# Patient Record
Sex: Male | Born: 1955
Health system: Southern US, Community
[De-identification: ages and names within clinical notes are randomized; demographics above are authoritative.]

## PROBLEM LIST (undated history)

## (undated) DIAGNOSIS — C61 Malignant neoplasm of prostate: Secondary | ICD-10-CM

## (undated) DIAGNOSIS — M109 Gout, unspecified: Secondary | ICD-10-CM

## (undated) DIAGNOSIS — B009 Herpesviral infection, unspecified: Secondary | ICD-10-CM

## (undated) DIAGNOSIS — G8929 Other chronic pain: Secondary | ICD-10-CM

## (undated) DIAGNOSIS — IMO0002 Reserved for concepts with insufficient information to code with codable children: Secondary | ICD-10-CM

## (undated) DIAGNOSIS — I10 Essential (primary) hypertension: Secondary | ICD-10-CM

## (undated) DIAGNOSIS — M549 Dorsalgia, unspecified: Secondary | ICD-10-CM

## (undated) DIAGNOSIS — N529 Male erectile dysfunction, unspecified: Secondary | ICD-10-CM

## (undated) HISTORY — DX: Other chronic pain: G89.29

## (undated) HISTORY — PX: COLONOSCOPY: SHX174

## (undated) HISTORY — DX: Dorsalgia, unspecified: M54.9

## (undated) HISTORY — DX: Male erectile dysfunction, unspecified: N52.9

## (undated) HISTORY — DX: Herpesviral infection, unspecified: B00.9

---

## 1999-11-07 HISTORY — PX: KNEE ARTHROSCOPY: SUR90

## 2007-05-08 ENCOUNTER — Encounter: Admission: RE | Admit: 2007-05-08 | Discharge: 2007-05-08 | Payer: Self-pay | Admitting: Family Medicine

## 2008-06-03 ENCOUNTER — Encounter: Admission: RE | Admit: 2008-06-03 | Discharge: 2008-06-03 | Payer: Self-pay | Admitting: Sports Medicine

## 2008-06-16 ENCOUNTER — Encounter: Admission: RE | Admit: 2008-06-16 | Discharge: 2008-06-16 | Payer: Self-pay | Admitting: Sports Medicine

## 2008-07-14 ENCOUNTER — Encounter: Admission: RE | Admit: 2008-07-14 | Discharge: 2008-07-14 | Payer: Self-pay | Admitting: Sports Medicine

## 2008-11-06 HISTORY — PX: LUMBAR FUSION: SHX111

## 2009-01-28 ENCOUNTER — Encounter: Admission: RE | Admit: 2009-01-28 | Discharge: 2009-01-28 | Payer: Self-pay | Admitting: Neurosurgery

## 2009-03-11 ENCOUNTER — Inpatient Hospital Stay (HOSPITAL_COMMUNITY): Admission: RE | Admit: 2009-03-11 | Discharge: 2009-03-15 | Payer: Self-pay | Admitting: Neurosurgery

## 2010-05-12 ENCOUNTER — Emergency Department (HOSPITAL_COMMUNITY): Admission: EM | Admit: 2010-05-12 | Discharge: 2010-05-12 | Payer: Self-pay | Admitting: Emergency Medicine

## 2010-05-18 ENCOUNTER — Ambulatory Visit (HOSPITAL_COMMUNITY): Payer: Self-pay | Admitting: Psychiatry

## 2010-05-18 ENCOUNTER — Other Ambulatory Visit (HOSPITAL_COMMUNITY): Admission: RE | Admit: 2010-05-18 | Discharge: 2010-06-03 | Payer: Self-pay | Admitting: Psychiatry

## 2010-06-03 ENCOUNTER — Ambulatory Visit: Payer: Self-pay | Admitting: Psychiatry

## 2010-09-04 IMAGING — CR DG CHEST 2V
2 series · 2 of 2 positions shown · non-contrast
Comparison: None

CLINICAL DATA: Preoperative evaluation for lumbar stenosis.
Hypertension.  Nonsmoker.  Sleep apnea.

CHEST - 2 VIEW

[view not recorded (1 of 2)]
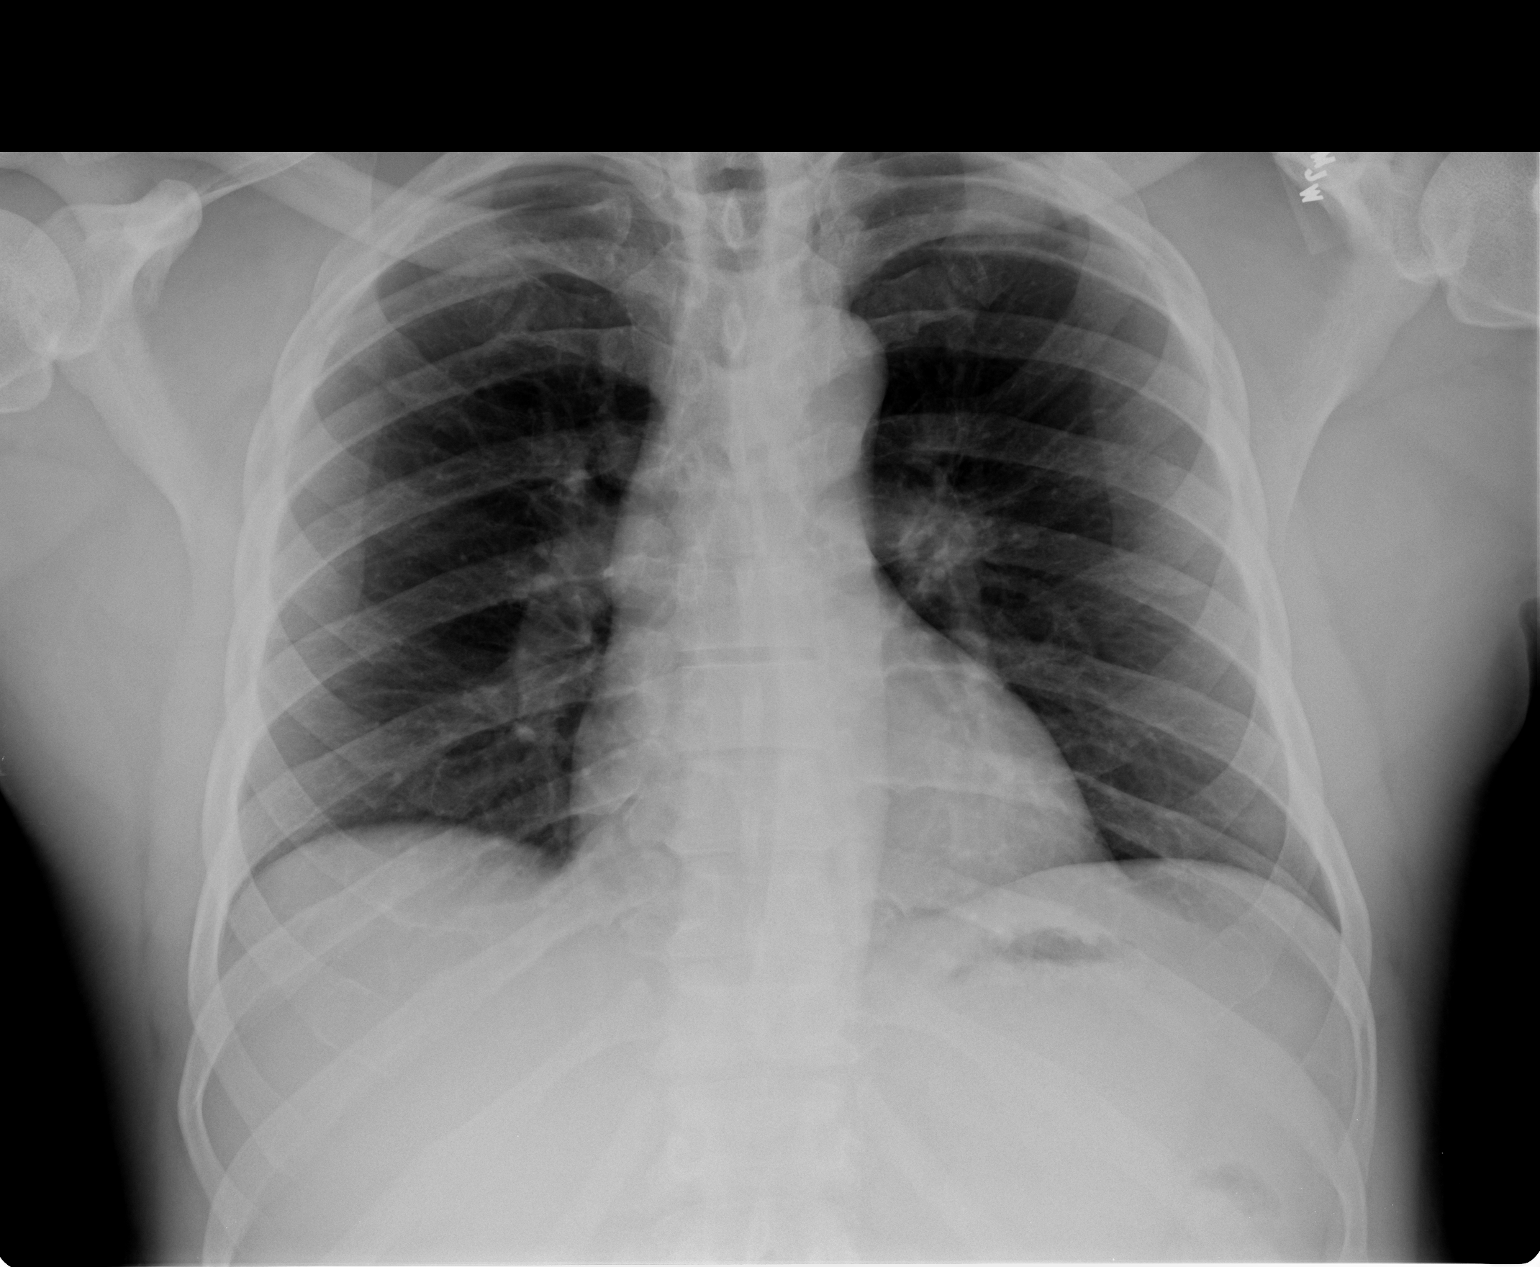

[view not recorded (2 of 2)]
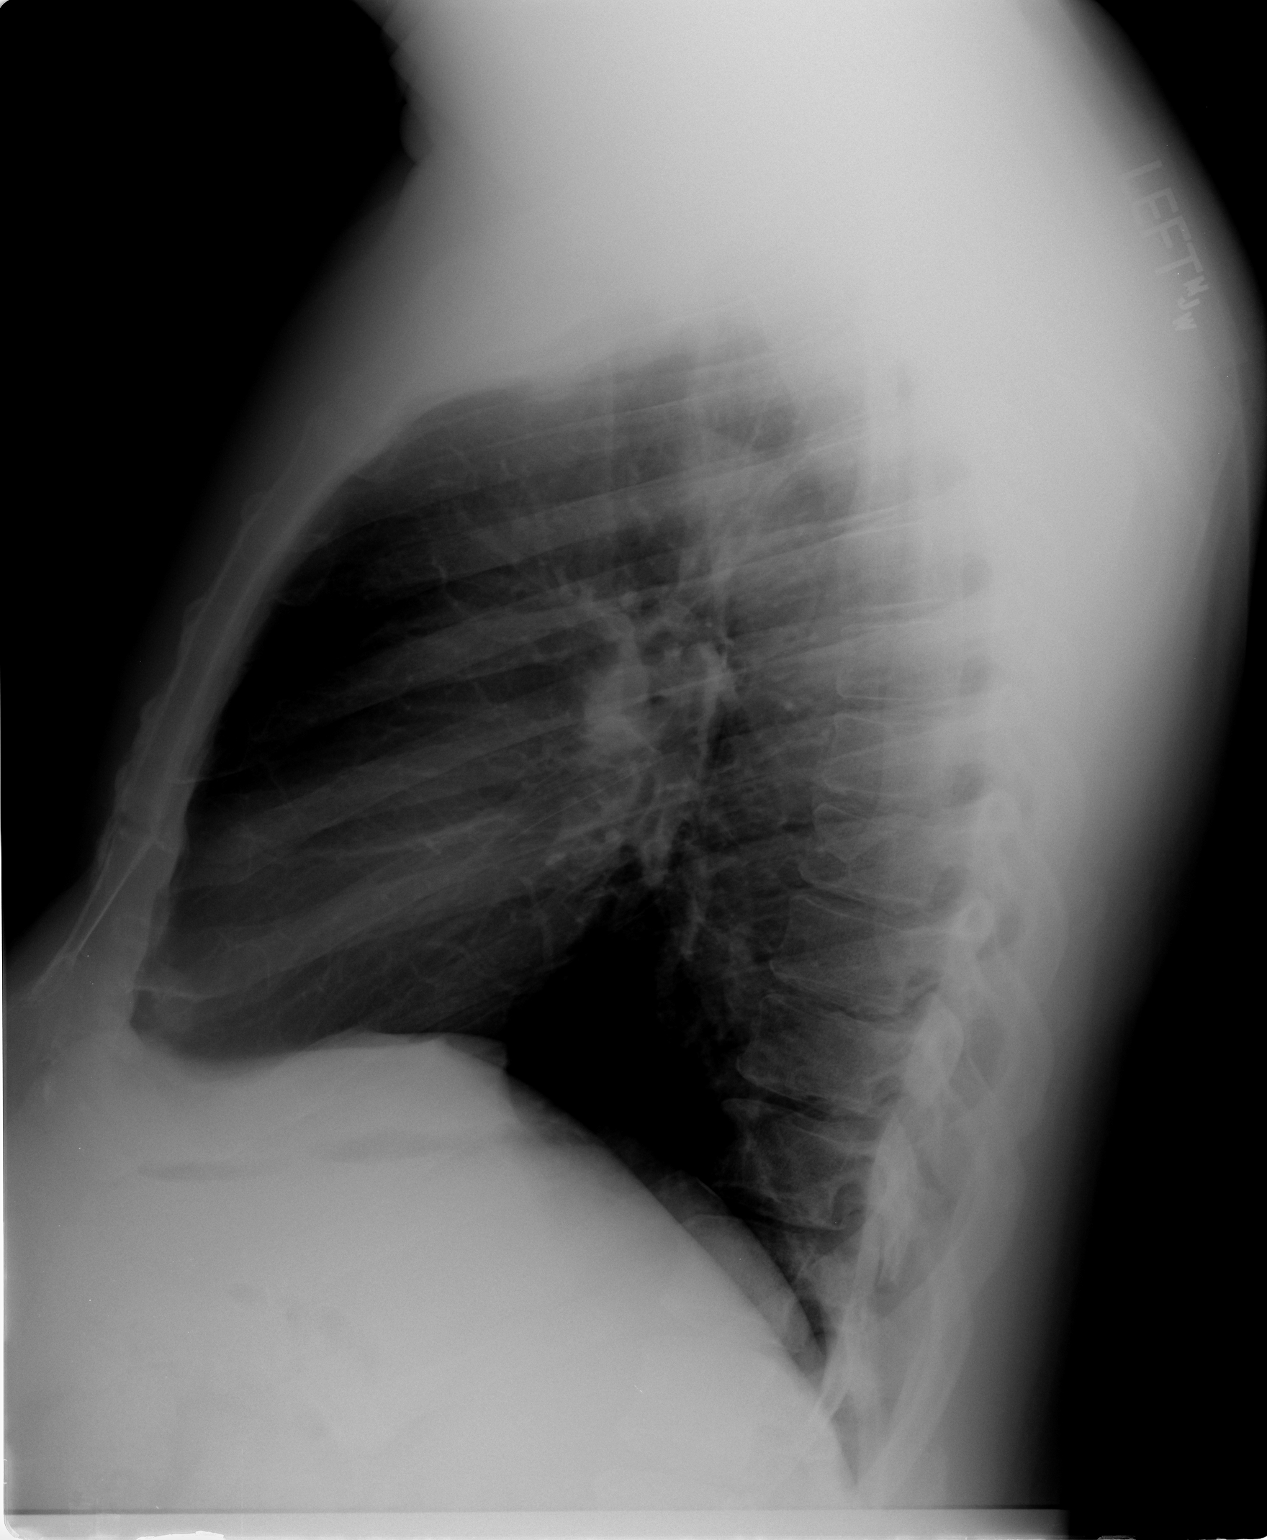

[2 of 2 positions shown; findings below may reference images not displayed]

FINDINGS: Heart and mediastinal contours are within normal limits.
The lung fields appear clear with no evidence for focal infiltrate
or congestive failure.  Bony structures demonstrate some mild
degenerative changes of the lower thoracic spine and are otherwise
intact.
IMPRESSION: No acute cardiopulmonary disease noted

## 2010-09-07 IMAGING — RF DG LUMBAR SPINE 2-3V
1 series · 2 of 2 positions shown · non-contrast
Comparison: Intraoperative plain films of same date.

CLINICAL DATA: Lumbar stenosis.  Degenerative disc disease.

LUMBAR SPINE - 2-3 VIEW

[Series 1: run · 2 of 2 slices shown]
[im 1/2]
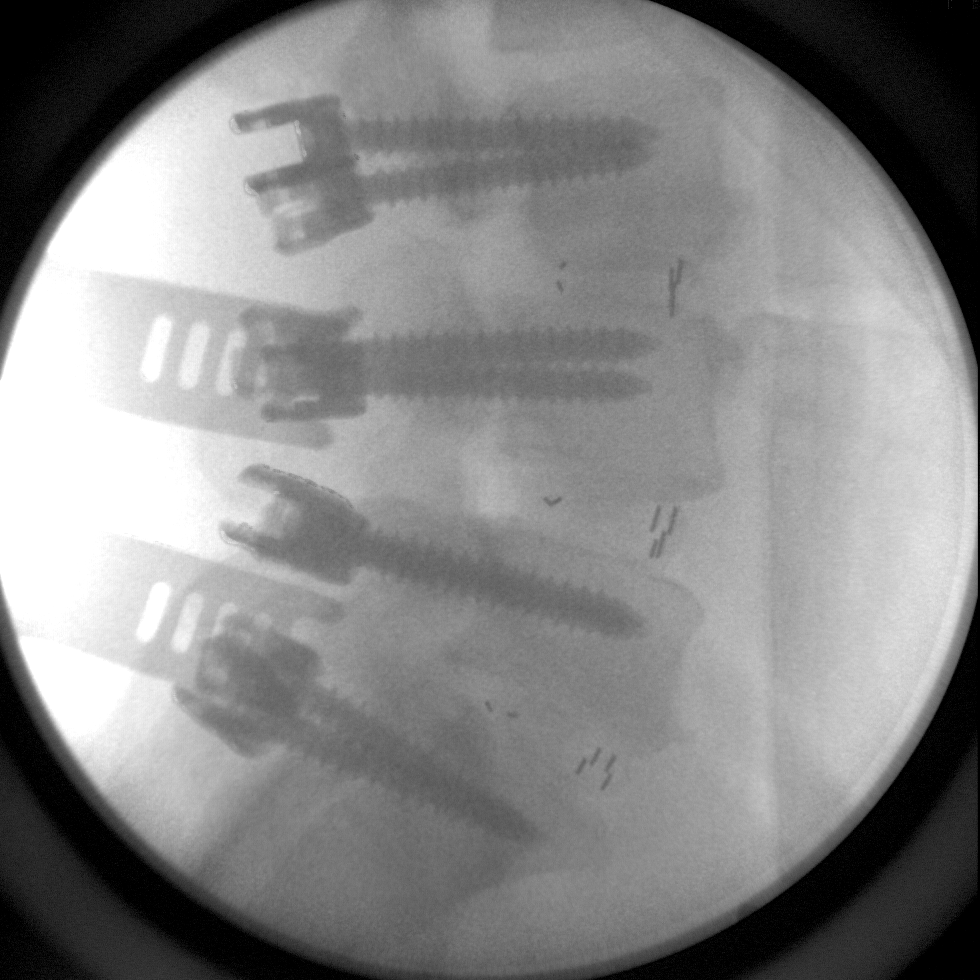
[im 2/2]
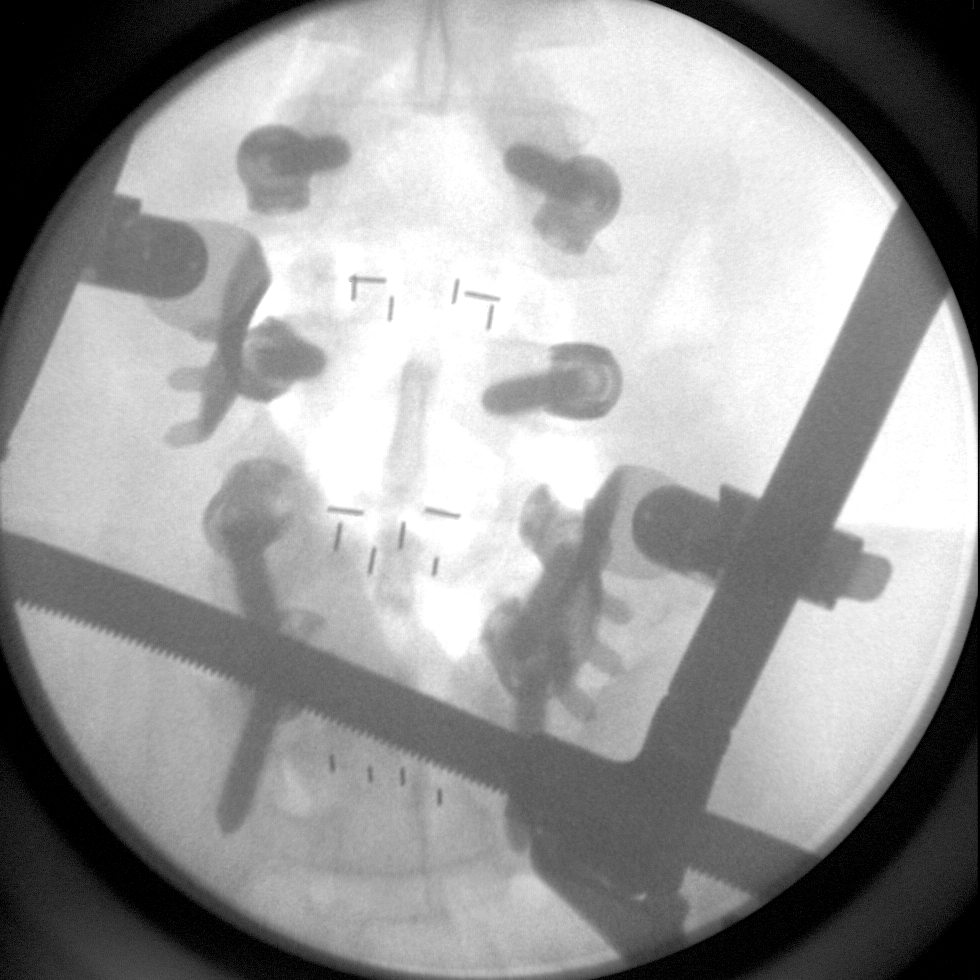

[2 of 2 positions shown; findings below may reference images not displayed]

FINDINGS: Two intraoperative fluoro spot films are submitted.  On
these films, on these films, pedicle screws have been placed at L3,
L4, L5, and S1.  The patient is status post discectomy at L3-4, L4-
5, and L5-S1.  Alignment is anatomic.  There is no radiographic
evidence for complication.
IMPRESSION: 1.  Interval placement pedicle screws from L3-S1.
2.  Status post discectomy at L3-4, L4-5, and L5-S1.
3.  No radiographic evidence for hardware complication.

## 2010-11-28 ENCOUNTER — Encounter: Payer: Self-pay | Admitting: Sports Medicine

## 2011-01-22 LAB — SALICYLATE LEVEL: Salicylate Lvl: <4 mg/dL (ref 2.8–20.0)

## 2011-01-22 LAB — RAPID URINE DRUG SCREEN, HOSP PERFORMED
Barbiturates: NOT DETECTED
Benzodiazepines: NOT DETECTED

## 2011-01-22 LAB — BASIC METABOLIC PANEL
BUN: 21 mg/dL (ref 6–23)
Calcium: 9.2 mg/dL (ref 8.4–10.5)
Chloride: 107 mEq/L (ref 96–112)
Creatinine, Ser: 1.34 mg/dL (ref 0.4–1.5)
GFR calc Af Amer: >60 mL/min (ref >60)

## 2011-01-22 LAB — ETHANOL: Alcohol, Ethyl (B): <5 mg/dL (ref 0–10)

## 2011-01-22 LAB — ACETAMINOPHEN LEVEL: Acetaminophen (Tylenol), Serum: <10 ug/mL — ABNORMAL LOW (ref 10–30)

## 2011-02-14 LAB — CBC
HCT: 28.2 % — ABNORMAL LOW (ref 39.0–52.0)
Hemoglobin: 12.9 g/dL — ABNORMAL LOW (ref 13.0–17.0)
Hemoglobin: 9.3 g/dL — ABNORMAL LOW (ref 13.0–17.0)
Hemoglobin: 9.5 g/dL — ABNORMAL LOW (ref 13.0–17.0)
MCHC: 33.7 g/dL (ref 30.0–36.0)
MCHC: 33.8 g/dL (ref 30.0–36.0)
MCV: 85.9 fL (ref 78.0–100.0)
Platelets: 122 10*3/uL — ABNORMAL LOW (ref 150–400)
RBC: 3.28 MIL/uL — ABNORMAL LOW (ref 4.22–5.81)
RBC: 4.46 MIL/uL (ref 4.22–5.81)
RDW: 14.3 % (ref 11.5–15.5)
RDW: 14.5 % (ref 11.5–15.5)
WBC: 4 10*3/uL (ref 4.0–10.5)

## 2011-02-14 LAB — BASIC METABOLIC PANEL
BUN: 25 mg/dL — ABNORMAL HIGH (ref 6–23)
CO2: 28 mEq/L (ref 19–32)
CO2: 29 mEq/L (ref 19–32)
CO2: 30 mEq/L (ref 19–32)
Calcium: 8.4 mg/dL (ref 8.4–10.5)
Calcium: 9.5 mg/dL (ref 8.4–10.5)
Chloride: 104 mEq/L (ref 96–112)
Chloride: 110 mEq/L (ref 96–112)
Creatinine, Ser: 1.9 mg/dL — ABNORMAL HIGH (ref 0.4–1.5)
Creatinine, Ser: 2.07 mg/dL — ABNORMAL HIGH (ref 0.4–1.5)
GFR calc Af Amer: 43 mL/min — ABNORMAL LOW (ref >60)
GFR calc Af Amer: 45 mL/min — ABNORMAL LOW (ref >60)
Glucose, Bld: 109 mg/dL — ABNORMAL HIGH (ref 70–99)
Glucose, Bld: 137 mg/dL — ABNORMAL HIGH (ref 70–99)
Sodium: 140 mEq/L (ref 135–145)
Sodium: 141 mEq/L (ref 135–145)

## 2011-02-14 LAB — NO BLOOD PRODUCTS

## 2011-03-10 ENCOUNTER — Encounter: Payer: Self-pay | Admitting: Family Medicine

## 2011-03-21 NOTE — Op Note (Signed)
NAME:  Lance Richardson, Lance Richardson NO.:  [id number]   MEDICAL RECORD NO.:  [id number]          PATIENT TYPE:  INP   LOCATION:  3172                         FACILITY:  [facility]   PHYSICIAN:  Cristi Loron, M.D.DATE OF BIRTH:  1956-06-16   DATE OF PROCEDURE:  03/11/2009  DATE OF DISCHARGE:                               OPERATIVE REPORT   BRIEF HISTORY:  The patient is a 55 year old black male who has suffered  from back and leg pain.  He failed nonsurgical management and he was  worked up with a lumbar MRI and lumbar diskogram, which demonstrated the  patient had degeneration and concordant pain at L3-L4, L4-L5, L5-S1.  I  discussed the various treatment options with the patient including  surgery.  The patient has weighed the risks, benefits, and alternatives  of surgery and decided to proceed with a L3-L4, L4-L5, L5-S1  decompression, instrumentation, and fusion.   PREOPERATIVE DIAGNOSES:  L3-L4, L4-L5, and L5-S1 degeneration, lumbago,  lumbar radiculopathy.   POSTOPERATIVE DIAGNOSES:  L3-L4, L4-L5, and L5-S1 degeneration, lumbago,  lumbar radiculopathy.   PROCEDURES:  L3-L4, L4-L5, L5-S1 posterior lumbar interbody fusion with  local morselized autograft bone and Actifuse bone graft extender/bone  morphogenic protein; insertion of bilateral interbody prosthesis at L3-  L4, L4-L5, L5-S1 (Capstone PEEK interbody prosthesis); posterior  segmental fixation, L3-S1 with Legacy titanium plates and rods; L3-L4,  L4-L5, and L5-S1 posterior arthrodesis with local morselized autograft  bone, Vitoss and bone morphogenic protein.   SURGEON:  Cristi Loron, MD.   ASSISTANT:  Hewitt Shorts, MD   ANESTHESIA:  General endotracheal.   ESTIMATED BLOOD LOSS:  500 mL.   SPECIMEN:  None.   DRAINS:  None.   COMPLICATIONS:  None.   DESCRIPTION OF PROCEDURE:  The patient was brought to the operating room  by the Anesthesia team.  General endotracheal anesthesia was  induced.  The patient was turned to the prone position on Wilson frame.  His  lumbosacral region was then prepared with Betadine scrub and Betadine  solution.  Sterile drapes were applied.  I then injected the area to be  incised with Marcaine with epinephrine solution.  I used scalpel to make  a linear midline incision over the of L3-L4, L4-L5, and L5-S1  interspaces.  I used electrocautery to performed a bilateral  subperiosteal dissection exposing the spinous process, lamina of  approximately L2 down to the upper sacrum.  We obtained intraoperative  radiograph to confirm our location.   We then inserted the Versatrac retractor for exposure.  We then used  high-speed drill to perform bilateral L3, L4, and L5 laminotomies.  We  widened the laminotomies with Kerrison punch, removing the L3-L4, L4-L5,  L5-S1 ligamentum flavum.  We then exposed the bilateral L3-L4, L4-L5,  and L5-S1 intervertebral disk.  We incised the intervertebral disk with  a 15-blade scalpel and performed a partial intervertebral diskectomy  with a pituitary forceps.  We then prepared the vertebral body endplates  for posterior lumbar fusion by using the curettes to clear soft tissues.  We used trial spacers and determined to  use 12- x 26-mm Capstone PEEK  interbody prosthesis at L3-L4, a 14 x 26 bilaterally at L4-L5, and a 10  x 26 bilaterally at L5-S1.  We prefilled these prosthesis with a  combination of local morselized autograft bone, which we obtained during  the decompression as well as bone morphogenic protein-soaked collagen  sponges as well as Actifuse bone graft extenders.  We inserted these  prosthesis at L3-L4, L4-L5, L5-S1 interspaces bilaterally, of course,  after retracting the neural structures out of harm's way.  There was a  good snug fit of the prosthesis at each level.  We also filled in  remainder of the disk space with BMP, local autograft bone, and  Actifuse.  This completed the posterior  lumbar interbody fusion.   We now turned our attention to the posterior instrumentation.  Under  fluoroscopic guidance, we cannulated the bilateral L3, L4, L5, and S1  pedicles with the bone probe.  We tapped the pedicles with a 6.5-mm tap  and then we removed the tap and probed inside the tapped pedicle with a  ball probe to look for cortical breaches.  I then inserted 7.5- x 50-mm  pedicle screws bilaterally at L3, L4 and L5 and 7.5 x 45 bilaterally at  S1.  We got good bony purchase.  We then palpated along the medial  aspect of bilateral L3, L4, L5, and S1 pedicles to rule out cortical  breeches, there were none.  We then connected unilateral pedicle screws  with a lordotic rod, compressed to the construct, secured to the rod in  place with the caps, which we tightened appropriately.  I then placed a  cross connector.  This completed the instrumentation.   We now turned our attention to the posterolateral arthrodesis.  We used  high-speed drill to decorticate the remainder of the L3-L4, L4-L5, L5-S1  facets, pars, transverse processes, etc.  We then laid a combination of  local morselized autograft bone and Vitoss bone graft extender as well  as bone morphogenic protein over these decorticated posterolateral  structures.  This completed the posterior arthrodesis.   We then inspected thecal sac and the bilateral L4, L5, and S1 nerve  roots and noted neural structures were well decompressed.  We obtained  hemostasis using bipolar electrocautery, irrigated the wound out with  bacitracin solution.  We then removed the retractor and reapproximated  the patient's thoracolumbar fascia with interrupted #1 Vicryl suture,  subcutaneous tissue with interrupted 2-0 Vicryl suture, and the skin  with Steri-Strips and Benzoin.  The wound was then coated with  bacitracin ointment.  A sterile dressing was applied.  The drapes were  removed.  Then, the patient was subsequently returned to supine  position  where he was extubated by the Anesthesia team and transported to the  postanesthesia care unit in stable condition.  All sponge, instrument,  and needle counts were correct at the end of this case.   ADDENDUM:  We performed a complete L3 laminectomy as the patient had  bilateral L3 pars defects and the lamina at this level was free  floating.  We removed the entire L3 lamina and used it as local  morselized autograft bone.      Cristi Loron, M.D.  Electronically Signed     JDJ/MEDQ  D:  03/11/2009  T:  03/12/2009  Job:  [id number]

## 2011-03-24 NOTE — Discharge Summary (Signed)
NAME:  Lance Richardson, Lance Richardson NO.:  [id number]   MEDICAL RECORD NO.:  [id number]          PATIENT TYPE:  INP   LOCATION:  3036                         FACILITY:  [facility]   PHYSICIAN:  Cristi Loron, M.D.DATE OF BIRTH:  Jul 13, 1956   DATE OF ADMISSION:  03/11/2009  DATE OF DISCHARGE:  03/15/2009                               DISCHARGE SUMMARY   BRIEF HISTORY:  The patient is a 55 year old black male who suffered  from back and leg pain.  He has failed nonsurgical management.  He was  worked up with lumbar MRI and lumbar diskogram, which demonstrated the  patient had degeneration and concordant pain at L3-4, L4-5, and L5-S1.  I discussed the various treatments options with the patient including  surgery.  The patient has weighed the risks, benefits, and alternatives  of surgery, and decided to proceed with an L3-4, L4-5, and L5-S1  decompression, instrumentation, and fusion.   For further details of this admission, please refer to typed history and  physical.   HOSPITAL COURSE:  On the day of admission, i.e. Mar 11, 2009, I performed  an L3-4, L4-5, L5-S1 decompression, instrumentation, and fusion.  The  surgery went well (for full details of the operation, please refer to  typed operative note).   POSTOPERATIVE COURSE:  The patient's postoperative course demonstrated  he developed acute blood loss anemia with hemoglobin to 9.5.  His BUN  and creatinine was a bit elevated at 27 and 1.98, but we repeated this  and it remained stable.  The patient's repeat hematocrit was 27.5.  We  had PT and OT see the patient to mobilize him.  The remainder of the  patient's hospital course was unremarkable.   By Mar 15, 2009, the patient was requesting discharge to home and was  discharged on that day.   DISCHARGE INSTRUCTIONS:  The patient was given discharge instructions.  Told to follow up with me in 4 weeks.  He was also instructed to follow  up with his primary medical doctor,  Dr. Fredrich Birks for his increased BUN  and creatinine.   DISCHARGE PRESCRIPTIONS:  1. Percocet 10/325, #100, one p.o. q.4 h. p.o. for pain.  2. Valium 5 mg, #50, one p.o. q.6 h. p.o. for muscle spasms.   FINAL DIAGNOSES:  L3-4, L4-5, and L5-S1 degenerative disk disease,  lumbago, and lumbar radiculopathy, elevated BUN and creatinine, blood  loss anemia.   PROCEDURE PERFORMED:  L3-4, L4-5, L5-S1 posterior lumbar interbody  fusion with local morselized autograft bone and Actifuse bone graft  extender/bone morphogenic protein; insertion of bilateral L3-4, L4-5,  and L5-S1  interbody prosthesis (capsule PEEK interbody prosthesis); posterior  segmental fixation L3-S1 with legacy titanium peg screws and rods; L3-4,  L4-5, and L5-S1 posterolateral arthrodesis with local morselized  autograft bone, VITOSS, and bone morphogenic protein.      Cristi Loron, M.D.  Electronically Signed     Cristi Loron, M.D.  Electronically Signed    JDJ/MEDQ  D:  04/01/2009  T:  04/02/2009  Job:  [id number]   cc:   Dr. Fredrich Birks

## 2011-11-21 DIAGNOSIS — M545 Low back pain: Secondary | ICD-10-CM | POA: Diagnosis not present

## 2012-03-18 DIAGNOSIS — I10 Essential (primary) hypertension: Secondary | ICD-10-CM | POA: Diagnosis not present

## 2012-03-18 DIAGNOSIS — R799 Abnormal finding of blood chemistry, unspecified: Secondary | ICD-10-CM | POA: Diagnosis not present

## 2012-03-20 DIAGNOSIS — E7800 Pure hypercholesterolemia, unspecified: Secondary | ICD-10-CM | POA: Diagnosis not present

## 2012-03-20 DIAGNOSIS — M109 Gout, unspecified: Secondary | ICD-10-CM | POA: Diagnosis not present

## 2012-03-20 DIAGNOSIS — I10 Essential (primary) hypertension: Secondary | ICD-10-CM | POA: Diagnosis not present

## 2012-07-11 DIAGNOSIS — B356 Tinea cruris: Secondary | ICD-10-CM | POA: Diagnosis not present

## 2012-07-11 DIAGNOSIS — L538 Other specified erythematous conditions: Secondary | ICD-10-CM | POA: Diagnosis not present

## 2012-07-31 DIAGNOSIS — D489 Neoplasm of uncertain behavior, unspecified: Secondary | ICD-10-CM | POA: Diagnosis not present

## 2012-08-20 ENCOUNTER — Encounter (HOSPITAL_BASED_OUTPATIENT_CLINIC_OR_DEPARTMENT_OTHER): Payer: Self-pay | Admitting: *Deleted

## 2012-08-20 DIAGNOSIS — D489 Neoplasm of uncertain behavior, unspecified: Secondary | ICD-10-CM | POA: Diagnosis not present

## 2012-08-20 NOTE — Progress Notes (Signed)
To come in for bmet-and ekg-if one at pcp over 12 mon

## 2012-08-22 ENCOUNTER — Encounter (HOSPITAL_BASED_OUTPATIENT_CLINIC_OR_DEPARTMENT_OTHER)
Admission: RE | Admit: 2012-08-22 | Discharge: 2012-08-22 | Disposition: A | Discharge status: Home / Self Care | Payer: Medicare Other | Source: Ambulatory Visit | Attending: Specialist | Admitting: Specialist

## 2012-08-22 DIAGNOSIS — I10 Essential (primary) hypertension: Secondary | ICD-10-CM | POA: Diagnosis not present

## 2012-08-22 DIAGNOSIS — D235 Other benign neoplasm of skin of trunk: Secondary | ICD-10-CM | POA: Diagnosis not present

## 2012-08-22 LAB — BASIC METABOLIC PANEL
BUN: 21 mg/dL (ref 6–23)
Creatinine, Ser: 1.6 mg/dL — ABNORMAL HIGH (ref 0.50–1.35)
GFR calc Af Amer: 54 mL/min — ABNORMAL LOW (ref >90)
GFR calc non Af Amer: 47 mL/min — ABNORMAL LOW (ref >90)

## 2012-08-26 ENCOUNTER — Ambulatory Visit (HOSPITAL_BASED_OUTPATIENT_CLINIC_OR_DEPARTMENT_OTHER): Payer: Medicare Other | Admitting: Certified Registered"

## 2012-08-26 ENCOUNTER — Encounter (HOSPITAL_BASED_OUTPATIENT_CLINIC_OR_DEPARTMENT_OTHER): Payer: Self-pay | Admitting: Certified Registered"

## 2012-08-26 ENCOUNTER — Encounter (HOSPITAL_BASED_OUTPATIENT_CLINIC_OR_DEPARTMENT_OTHER): Payer: Self-pay

## 2012-08-26 ENCOUNTER — Encounter (HOSPITAL_BASED_OUTPATIENT_CLINIC_OR_DEPARTMENT_OTHER)
Admission: RE | Disposition: A | Discharge status: Home / Self Care | Payer: Self-pay | Source: Ambulatory Visit | Attending: Specialist

## 2012-08-26 ENCOUNTER — Ambulatory Visit (HOSPITAL_BASED_OUTPATIENT_CLINIC_OR_DEPARTMENT_OTHER)
Admission: RE | Admit: 2012-08-26 | Discharge: 2012-08-26 | Disposition: A | Discharge status: Home / Self Care | Payer: Medicare Other | Source: Ambulatory Visit | Attending: Specialist | Admitting: Specialist

## 2012-08-26 DIAGNOSIS — D235 Other benign neoplasm of skin of trunk: Secondary | ICD-10-CM | POA: Insufficient documentation

## 2012-08-26 DIAGNOSIS — I10 Essential (primary) hypertension: Secondary | ICD-10-CM | POA: Diagnosis not present

## 2012-08-26 DIAGNOSIS — L723 Sebaceous cyst: Secondary | ICD-10-CM | POA: Diagnosis not present

## 2012-08-26 DIAGNOSIS — R222 Localized swelling, mass and lump, trunk: Secondary | ICD-10-CM | POA: Diagnosis not present

## 2012-08-26 DIAGNOSIS — D485 Neoplasm of uncertain behavior of skin: Secondary | ICD-10-CM | POA: Diagnosis not present

## 2012-08-26 HISTORY — DX: Essential (primary) hypertension: I10

## 2012-08-26 HISTORY — DX: Reserved for concepts with insufficient information to code with codable children: IMO0002

## 2012-08-26 HISTORY — PX: MASS EXCISION: SHX2000

## 2012-08-26 LAB — POCT HEMOGLOBIN-HEMACUE: Hemoglobin: 12.1 g/dL — ABNORMAL LOW (ref 13.0–17.0)

## 2012-08-26 SURGERY — EXCISION MASS
Anesthesia: General | Site: Breast | Laterality: Left | Wound class: Clean

## 2012-08-26 MED ORDER — PROPOFOL 10 MG/ML IV BOLUS
INTRAVENOUS | Status: DC | PRN
  Administered 2012-08-26: 300 mg via INTRAVENOUS

## 2012-08-26 MED ORDER — SODIUM CHLORIDE 0.9 % IV SOLN
INTRAVENOUS | Status: DC | PRN
  Administered 2012-08-26: 50 mL via INTRAMUSCULAR

## 2012-08-26 MED ORDER — ONDANSETRON HCL 4 MG/2ML IJ SOLN
INTRAMUSCULAR | Status: DC | PRN
  Administered 2012-08-26: 4 mg via INTRAVENOUS

## 2012-08-26 MED ORDER — LIDOCAINE-EPINEPHRINE 0.5 %-1:200000 IJ SOLN
INTRAMUSCULAR | Status: DC | PRN
  Administered 2012-08-26: 50 mL

## 2012-08-26 MED ORDER — HYDROMORPHONE HCL PF 1 MG/ML IJ SOLN: 0.2500 mg | INTRAMUSCULAR | Status: DC | PRN

## 2012-08-26 MED ORDER — EPHEDRINE SULFATE 50 MG/ML IJ SOLN
INTRAMUSCULAR | Status: DC | PRN
  Administered 2012-08-26 (×2): 5 mg via INTRAVENOUS
  Administered 2012-08-26: 10 mg via INTRAVENOUS

## 2012-08-26 MED ORDER — LACTATED RINGERS IV SOLN
INTRAVENOUS | Status: DC
  Administered 2012-08-26 (×2): via INTRAVENOUS

## 2012-08-26 MED ORDER — ONDANSETRON HCL 4 MG/2ML IJ SOLN: 4.0000 mg | Freq: Once | INTRAMUSCULAR | Status: DC | PRN

## 2012-08-26 MED ORDER — OXYCODONE HCL 5 MG/5ML PO SOLN: 5.0000 mg | Freq: Once | ORAL | Status: DC | PRN

## 2012-08-26 MED ORDER — DEXAMETHASONE SODIUM PHOSPHATE 4 MG/ML IJ SOLN
INTRAMUSCULAR | Status: DC | PRN
  Administered 2012-08-26: 8 mg via INTRAVENOUS

## 2012-08-26 MED ORDER — LIDOCAINE HCL (CARDIAC) 20 MG/ML IV SOLN
INTRAVENOUS | Status: DC | PRN
  Administered 2012-08-26: 75 mg via INTRAVENOUS

## 2012-08-26 MED ORDER — FENTANYL CITRATE 0.05 MG/ML IJ SOLN
INTRAMUSCULAR | Status: DC | PRN
  Administered 2012-08-26: 100 ug via INTRAVENOUS

## 2012-08-26 MED ORDER — CEFAZOLIN SODIUM-DEXTROSE 2-3 GM-% IV SOLR
2.0000 g | INTRAVENOUS | Status: AC
  Administered 2012-08-26: 2 g via INTRAVENOUS

## 2012-08-26 MED ORDER — TRIAMCINOLONE ACETONIDE 40 MG/ML IJ SUSP
INTRAMUSCULAR | Status: DC | PRN
  Administered 2012-08-26: 40 mg via INTRAMUSCULAR

## 2012-08-26 MED ORDER — OXYCODONE HCL 5 MG PO TABS: 5.0000 mg | ORAL_TABLET | Freq: Once | ORAL | Status: DC | PRN

## 2012-08-26 MED ORDER — MIDAZOLAM HCL 5 MG/5ML IJ SOLN
INTRAMUSCULAR | Status: DC | PRN
  Administered 2012-08-26: 2 mg via INTRAVENOUS

## 2012-08-26 SURGICAL SUPPLY — 61 items
APL SKNCLS STERI-STRIP NONHPOA (GAUZE/BANDAGES/DRESSINGS) ×1
BAG DECANTER FOR FLEXI CONT (MISCELLANEOUS) ×2 IMPLANT
BANDAGE ELASTIC 4 VELCRO ST LF (GAUZE/BANDAGES/DRESSINGS) IMPLANT
BANDAGE GAUZE ELAST BULKY 4 IN (GAUZE/BANDAGES/DRESSINGS) IMPLANT
BENZOIN TINCTURE PRP APPL 2/3 (GAUZE/BANDAGES/DRESSINGS) ×2 IMPLANT
BLADE KNIFE PERSONA 10 (BLADE) ×2 IMPLANT
BLADE KNIFE PERSONA 15 (BLADE) ×2 IMPLANT
BNDG COHESIVE 4X5 TAN STRL (GAUZE/BANDAGES/DRESSINGS) IMPLANT
CANISTER SUCTION 1200CC (MISCELLANEOUS) IMPLANT
CLEANER CAUTERY TIP 5X5 PAD (MISCELLANEOUS) ×1 IMPLANT
CLOTH BEACON ORANGE TIMEOUT ST (SAFETY) ×2 IMPLANT
COTTONBALL LRG STERILE PKG (GAUZE/BANDAGES/DRESSINGS) ×2 IMPLANT
COVER MAYO STAND STRL (DRAPES) ×2 IMPLANT
COVER TABLE BACK 60X90 (DRAPES) ×2 IMPLANT
DRAPE LAPAROSCOPIC ABDOMINAL (DRAPES) ×2 IMPLANT
DRAPE U-SHAPE 76X120 STRL (DRAPES) ×2 IMPLANT
DRESSING TELFA 8X3 (GAUZE/BANDAGES/DRESSINGS) ×2 IMPLANT
DRSG PAD ABDOMINAL 8X10 ST (GAUZE/BANDAGES/DRESSINGS) IMPLANT
ELECT NEEDLE TIP 2.8 STRL (NEEDLE) ×2 IMPLANT
ELECT REM PT RETURN 9FT ADLT (ELECTROSURGICAL) ×2
ELECTRODE REM PT RTRN 9FT ADLT (ELECTROSURGICAL) ×1 IMPLANT
FILTER 7/8 IN (FILTER) ×2 IMPLANT
GAUZE SPONGE 4X4 12PLY STRL LF (GAUZE/BANDAGES/DRESSINGS) IMPLANT
GAUZE SPONGE 4X4 16PLY XRAY LF (GAUZE/BANDAGES/DRESSINGS) IMPLANT
GAUZE XEROFORM 1X8 LF (GAUZE/BANDAGES/DRESSINGS) ×2 IMPLANT
GAUZE XEROFORM 5X9 LF (GAUZE/BANDAGES/DRESSINGS) IMPLANT
GLOVE BIO SURGEON STRL SZ 6.5 (GLOVE) ×2 IMPLANT
GLOVE BIOGEL M 7.0 STRL (GLOVE) ×2 IMPLANT
GLOVE BIOGEL M STRL SZ7.5 (GLOVE) IMPLANT
GLOVE BIOGEL PI IND STRL 8 (GLOVE) IMPLANT
GLOVE BIOGEL PI INDICATOR 8 (GLOVE)
GLOVE ECLIPSE 7.0 STRL STRAW (GLOVE) ×2 IMPLANT
GOWN PREVENTION PLUS XLARGE (GOWN DISPOSABLE) ×2 IMPLANT
GOWN PREVENTION PLUS XXLARGE (GOWN DISPOSABLE) ×2 IMPLANT
NEEDLE HYPO 25X1 1.5 SAFETY (NEEDLE) ×2 IMPLANT
PACK BASIN DAY SURGERY FS (CUSTOM PROCEDURE TRAY) ×2 IMPLANT
PAD CLEANER CAUTERY TIP 5X5 (MISCELLANEOUS) ×1
PENCIL BUTTON HOLSTER BLD 10FT (ELECTRODE) IMPLANT
SHEET MEDIUM DRAPE 40X70 STRL (DRAPES) IMPLANT
SHEETING SILICONE GEL EPI DERM (MISCELLANEOUS) ×2 IMPLANT
SLEEVE SCD COMPRESS KNEE MED (MISCELLANEOUS) ×2 IMPLANT
SPONGE GAUZE 4X4 12PLY (GAUZE/BANDAGES/DRESSINGS) IMPLANT
SPONGE LAP 18X18 X RAY DECT (DISPOSABLE) ×2 IMPLANT
STAPLER VISISTAT 35W (STAPLE) IMPLANT
STOCKINETTE 4X48 STRL (DRAPES) IMPLANT
STRIP CLOSURE SKIN 1/2X4 (GAUZE/BANDAGES/DRESSINGS) IMPLANT
SUCTION FRAZIER TIP 10 FR DISP (SUCTIONS) IMPLANT
SUT MNCRL AB 3-0 PS2 18 (SUTURE) ×2 IMPLANT
SUT MON AB 2-0 CT1 36 (SUTURE) ×2 IMPLANT
SUT PROLENE 4 0 P 3 18 (SUTURE) IMPLANT
SUT PROLENE 4 0 PS 2 18 (SUTURE) IMPLANT
SUT SILK 3 0 PS 1 (SUTURE) IMPLANT
SYR CONTROL 10ML LL (SYRINGE) ×2 IMPLANT
TAPE HYPAFIX 6X30 (GAUZE/BANDAGES/DRESSINGS) IMPLANT
TOWEL OR 17X24 6PK STRL BLUE (TOWEL DISPOSABLE) ×4 IMPLANT
TRAY DSU PREP LF (CUSTOM PROCEDURE TRAY) ×2 IMPLANT
TUBE CONNECTING 20X1/4 (TUBING) IMPLANT
UNDERPAD 30X30 INCONTINENT (UNDERPADS AND DIAPERS) ×4 IMPLANT
VAC PENCILS W/TUBING CLEAR (MISCELLANEOUS) ×2 IMPLANT
WATER STERILE IRR 1000ML POUR (IV SOLUTION) ×2 IMPLANT
YANKAUER SUCT BULB TIP NO VENT (SUCTIONS) ×2 IMPLANT

## 2012-08-26 NOTE — Anesthesia Postprocedure Evaluation (Signed)
  Anesthesia Post-op Note  Patient: Lance Richardson  Procedure(s) Performed: Procedure(s) (LRB) with comments: EXCISION MASS (Left) - excis mass left breast plastic closure   Patient Location: PACU  Anesthesia Type: General  Level of Consciousness: awake, alert  and oriented  Airway and Oxygen Therapy: Patient Spontanous Breathing  Post-op Pain: mild  Post-op Assessment: Post-op Vital signs reviewed  Post-op Vital Signs: Reviewed  Complications: No apparent anesthesia complications

## 2012-08-26 NOTE — Brief Op Note (Signed)
08/26/2012  8:27 AM  PATIENT:  Lance Richardson  56 y.o. male  PRE-OPERATIVE DIAGNOSIS:  large mass left chest  POST-OPERATIVE DIAGNOSIS:  large mass left chest  PROCEDURE:  Procedure(s) (LRB) with comments: EXCISION MASS (Left) - excis mass left breast plastic closure   SURGEON:  Surgeon(s) and Role:    * Louisa Second, MD - Primary  PHYSICIAN ASSISTANT:   ASSISTANTS: none   ANESTHESIA:   none  EBL:  Total I/O In: 1000 [I.V.:1000] Out: -   BLOOD ADMINISTERED:none  DRAINS: none   LOCAL MEDICATIONS USED:  XYLOCAINE   SPECIMEN:  Excision  DISPOSITION OF SPECIMEN:  PATHOLOGY  COUNTS:  YES  TOURNIQUET:  * No tourniquets in log *  DICTATION: .Other Dictation: Dictation Number [id number]  PLAN OF CARE: Discharge to home after PACU  PATIENT DISPOSITION:  PACU - hemodynamically stable.   Delay start of Pharmacological VTE agent (>24hrs) due to surgical blood loss or risk of bleeding: not applicable

## 2012-08-26 NOTE — Op Note (Signed)
NAMEMarland Kitchen  CAID, RADIN NO.:  [id number]  MEDICAL RECORD NO.:  [id number]  LOCATION:                               FACILITY:  [facility]  PHYSICIAN:  Yaakov Guthrie. Shon Hough, M.D.   DATE OF BIRTH:  DATE OF PROCEDURE:  08/26/2012 DATE OF DISCHARGE:  08/26/2012                              OPERATIVE REPORT   A 56 year old gentleman with cystic lesions involving the left chest area, increased growth and discomfort, multiple in nature and approximately 3-1/2 clustered tight lesions, increased pain and discomfort.  PROCEDURES DONE:  Wide excision and a elliptical excision, flap reconstruction, and advancement closure.  ANESTHESIA:  General.  Preoperatively, the patient underwent drawings of the areas.  He then underwent general anesthesia, intubated orally.  Prep was done to the chest, breast areas in routine fashion with Hibiclens soap and solution, walled off with sterile towels and drapes so as to make a sterile field. Xylocaine 0.25% with epinephrine was injected locally, a total of 50 mL. This was allowed to sit up and vasoconstrict and after sterile towels had been replaced, wide incision was made of the elliptical area horizontally with #15 blade down the underlying subcutaneous tissue. Hemostasis was maintained with the Bovie anticoagulation.  Next double hooks were placed and the tissues were freed up significantly superiorly and inferiorly to allow advancement flap reconstruction of the area with deep sutures of 2-0 Monocryl x2 layers, a subcuticular suture of 3-0 Monocryl, and a running subcuticular stitch of 3-0 Monocryl.  Half-inch Steri-Strips were applied.  The edges of the wounds were injected with triamcinolone 10 mg/mL, total of 0.5 mL and then silicone gel patch was also placed over the area, hopefully preventive for increased hypertrophic scarring postop keloidosis.  Steri-Strips and silicone gel patches were placed in sterile dressings.  He withstood the  procedures very well and was taken to recovery in excellent condition.     Yaakov Guthrie. Shon Hough, M.D.     Cathie Hoops  D:  08/26/2012  T:  08/26/2012  Job:  [id number]

## 2012-08-26 NOTE — Anesthesia Preprocedure Evaluation (Addendum)
Anesthesia Evaluation  Patient identified by MRN, date of birth, ID band Patient awake    Reviewed: Allergy & Precautions, H&P , NPO status , Patient's Chart, lab work & pertinent test results  Airway Mallampati: I TM Distance: >3 FB Neck ROM: Full    Dental  (+) Teeth Intact and Dental Advisory Given   Pulmonary  breath sounds clear to auscultation        Cardiovascular hypertension, Rhythm:Regular Rate:Normal     Neuro/Psych    GI/Hepatic   Endo/Other  Morbid obesity  Renal/GU      Musculoskeletal   Abdominal   Peds  Hematology   Anesthesia Other Findings   Reproductive/Obstetrics                          Anesthesia Physical Anesthesia Plan  ASA: II  Anesthesia Plan: General   Post-op Pain Management:    Induction: Intravenous  Airway Management Planned: LMA  Additional Equipment:   Intra-op Plan:   Post-operative Plan: Extubation in OR  Informed Consent: I have reviewed the patients History and Physical, chart, labs and discussed the procedure including the risks, benefits and alternatives for the proposed anesthesia with the patient or authorized representative who has indicated his/her understanding and acceptance.   Dental advisory given  Plan Discussed with: CRNA, Anesthesiologist and Surgeon  Anesthesia Plan Comments:         Anesthesia Quick Evaluation

## 2012-08-26 NOTE — Transfer of Care (Signed)
Immediate Anesthesia Transfer of Care Note  Patient: Lance Richardson  Procedure(s) Performed: Procedure(s) (LRB) with comments: EXCISION MASS (Left) - excis mass left breast plastic closure   Patient Location: PACU  Anesthesia Type: General  Level of Consciousness: awake, alert , oriented and patient cooperative  Airway & Oxygen Therapy: Patient Spontanous Breathing and Patient connected to face mask oxygen  Post-op Assessment: Report given to PACU RN and Post -op Vital signs reviewed and stable  Post vital signs: Reviewed and stable  Complications: No apparent anesthesia complications

## 2012-08-26 NOTE — H&P (Signed)
Lance Richardson is an 56 y.o. male.   Chief Complaint: Painful keloidosis of chest HPI: S/P excision of lesion of the chest with the above resultant events  Past Medical History  Diagnosis Date  . No pertinent past medical history   . Hypertension   . DDD (degenerative disc disease)     Past Surgical History  Procedure Date  . Lumbar fusion 2010  . Knee arthroscopy 2001    right meniscus tear  . Colonoscopy     History reviewed. No pertinent family history. Social History:  reports that he has never smoked. He does not have any smokeless tobacco history on file. He reports that he does not drink alcohol or use illicit drugs.  Allergies: No Known Allergies  Medications Prior to Admission  Medication Sig Dispense Refill  . lisinopril-hydrochlorothiazide (PRINZIDE,ZESTORETIC) 10-12.5 MG per tablet Take 1 tablet by mouth daily.        No results found for this or any previous visit (from the past 48 hour(s)). No results found.  Review of Systems  Constitutional: Negative.   HENT: Negative.   Eyes: Negative.   Respiratory: Negative.   Cardiovascular: Negative.   Gastrointestinal: Negative.   Genitourinary: Negative.   Musculoskeletal: Negative.   Skin: Negative.   Neurological: Negative.   Endo/Heme/Allergies: Negative.   Psychiatric/Behavioral: Negative.     Blood pressure 129/84, pulse 52, temperature 98 F (36.7 C), temperature source Oral, resp. rate 20, height 6\' 1"  (1.854 m), weight 112.129 kg (247 lb 3.2 oz), SpO2 100.00%. 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

## 2012-08-26 NOTE — Anesthesia Procedure Notes (Signed)
Procedure Name: LMA Insertion Date/Time: 08/26/2012 7:49 AM Performed by: Verlan Friends Pre-anesthesia Checklist: Patient identified, Emergency Drugs available, Suction available, Patient being monitored and Timeout performed Patient Re-evaluated:Patient Re-evaluated prior to inductionOxygen Delivery Method: Circle System Utilized Preoxygenation: Pre-oxygenation with 100% oxygen Intubation Type: IV induction Ventilation: Mask ventilation without difficulty LMA: LMA inserted LMA Size: 5.0 Number of attempts: 1 Airway Equipment and Method: bite block Placement Confirmation: positive ETCO2 Tube secured with: Tape Dental Injury: Teeth and Oropharynx as per pre-operative assessment

## 2012-08-28 ENCOUNTER — Encounter (HOSPITAL_BASED_OUTPATIENT_CLINIC_OR_DEPARTMENT_OTHER): Payer: Self-pay | Admitting: Specialist

## 2012-08-28 ENCOUNTER — Encounter (HOSPITAL_BASED_OUTPATIENT_CLINIC_OR_DEPARTMENT_OTHER): Payer: Self-pay

## 2012-09-25 DIAGNOSIS — I10 Essential (primary) hypertension: Secondary | ICD-10-CM | POA: Diagnosis not present

## 2012-09-25 DIAGNOSIS — R809 Proteinuria, unspecified: Secondary | ICD-10-CM | POA: Diagnosis not present

## 2012-09-30 DIAGNOSIS — N182 Chronic kidney disease, stage 2 (mild): Secondary | ICD-10-CM | POA: Diagnosis not present

## 2012-09-30 DIAGNOSIS — D509 Iron deficiency anemia, unspecified: Secondary | ICD-10-CM | POA: Diagnosis not present

## 2012-09-30 DIAGNOSIS — I10 Essential (primary) hypertension: Secondary | ICD-10-CM | POA: Diagnosis not present

## 2012-09-30 DIAGNOSIS — N251 Nephrogenic diabetes insipidus: Secondary | ICD-10-CM | POA: Diagnosis not present

## 2012-11-15 DIAGNOSIS — Z8659 Personal history of other mental and behavioral disorders: Secondary | ICD-10-CM | POA: Diagnosis not present

## 2012-11-15 DIAGNOSIS — H109 Unspecified conjunctivitis: Secondary | ICD-10-CM | POA: Diagnosis not present

## 2012-12-12 DIAGNOSIS — L280 Lichen simplex chronicus: Secondary | ICD-10-CM | POA: Diagnosis not present

## 2013-02-05 DIAGNOSIS — E7800 Pure hypercholesterolemia, unspecified: Secondary | ICD-10-CM | POA: Diagnosis not present

## 2013-02-05 DIAGNOSIS — I10 Essential (primary) hypertension: Secondary | ICD-10-CM | POA: Diagnosis not present

## 2013-02-05 DIAGNOSIS — Z79899 Other long term (current) drug therapy: Secondary | ICD-10-CM | POA: Diagnosis not present

## 2013-02-05 DIAGNOSIS — M109 Gout, unspecified: Secondary | ICD-10-CM | POA: Diagnosis not present

## 2013-06-02 DIAGNOSIS — E7800 Pure hypercholesterolemia, unspecified: Secondary | ICD-10-CM | POA: Diagnosis not present

## 2013-06-02 DIAGNOSIS — Z79899 Other long term (current) drug therapy: Secondary | ICD-10-CM | POA: Diagnosis not present

## 2013-06-02 DIAGNOSIS — M109 Gout, unspecified: Secondary | ICD-10-CM | POA: Diagnosis not present

## 2013-06-11 DIAGNOSIS — R7989 Other specified abnormal findings of blood chemistry: Secondary | ICD-10-CM | POA: Diagnosis not present

## 2013-06-11 DIAGNOSIS — M545 Low back pain: Secondary | ICD-10-CM | POA: Diagnosis not present

## 2013-06-11 DIAGNOSIS — E782 Mixed hyperlipidemia: Secondary | ICD-10-CM | POA: Diagnosis not present

## 2013-06-11 DIAGNOSIS — I10 Essential (primary) hypertension: Secondary | ICD-10-CM | POA: Diagnosis not present

## 2013-06-11 DIAGNOSIS — E669 Obesity, unspecified: Secondary | ICD-10-CM | POA: Diagnosis not present

## 2013-06-11 DIAGNOSIS — N189 Chronic kidney disease, unspecified: Secondary | ICD-10-CM | POA: Diagnosis not present

## 2013-06-11 DIAGNOSIS — IMO0002 Reserved for concepts with insufficient information to code with codable children: Secondary | ICD-10-CM | POA: Diagnosis not present

## 2013-06-11 DIAGNOSIS — N529 Male erectile dysfunction, unspecified: Secondary | ICD-10-CM | POA: Diagnosis not present

## 2013-08-27 DIAGNOSIS — M545 Low back pain: Secondary | ICD-10-CM | POA: Diagnosis not present

## 2013-08-27 DIAGNOSIS — I10 Essential (primary) hypertension: Secondary | ICD-10-CM | POA: Diagnosis not present

## 2013-08-27 DIAGNOSIS — N400 Enlarged prostate without lower urinary tract symptoms: Secondary | ICD-10-CM | POA: Diagnosis not present

## 2013-08-27 DIAGNOSIS — E559 Vitamin D deficiency, unspecified: Secondary | ICD-10-CM | POA: Diagnosis not present

## 2013-08-27 DIAGNOSIS — N189 Chronic kidney disease, unspecified: Secondary | ICD-10-CM | POA: Diagnosis not present

## 2013-08-27 DIAGNOSIS — E7800 Pure hypercholesterolemia, unspecified: Secondary | ICD-10-CM | POA: Diagnosis not present

## 2013-08-27 DIAGNOSIS — Z79899 Other long term (current) drug therapy: Secondary | ICD-10-CM | POA: Diagnosis not present

## 2013-08-27 DIAGNOSIS — R3911 Hesitancy of micturition: Secondary | ICD-10-CM | POA: Diagnosis not present

## 2013-09-25 DIAGNOSIS — R972 Elevated prostate specific antigen [PSA]: Secondary | ICD-10-CM | POA: Diagnosis not present

## 2013-09-30 DIAGNOSIS — H109 Unspecified conjunctivitis: Secondary | ICD-10-CM | POA: Diagnosis not present

## 2013-10-13 DIAGNOSIS — R972 Elevated prostate specific antigen [PSA]: Secondary | ICD-10-CM | POA: Diagnosis not present

## 2013-11-14 DIAGNOSIS — C61 Malignant neoplasm of prostate: Secondary | ICD-10-CM | POA: Diagnosis not present

## 2013-11-14 DIAGNOSIS — R972 Elevated prostate specific antigen [PSA]: Secondary | ICD-10-CM | POA: Diagnosis not present

## 2013-11-14 HISTORY — DX: Malignant neoplasm of prostate: C61

## 2013-11-18 HISTORY — PX: PROSTATE BIOPSY: SHX241

## 2013-11-27 DIAGNOSIS — C61 Malignant neoplasm of prostate: Secondary | ICD-10-CM | POA: Diagnosis not present

## 2013-11-28 ENCOUNTER — Encounter: Payer: Self-pay | Admitting: Radiation Oncology

## 2013-11-28 DIAGNOSIS — C61 Malignant neoplasm of prostate: Secondary | ICD-10-CM | POA: Insufficient documentation

## 2013-12-01 ENCOUNTER — Encounter: Payer: Self-pay | Admitting: Radiation Oncology

## 2013-12-01 NOTE — Progress Notes (Signed)
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 no  Pacemaker/ICD? no  Possible current pregnancy? na  Is the patient on methotrexate? no  Current Complaints / other details:  Married,  1 son , physical disability affecting ability to work. Pt is leaning away from surgery. Anxious

## 2013-12-03 ENCOUNTER — Ambulatory Visit
Admission: RE | Admit: 2013-12-03 | Discharge: 2013-12-03 | Disposition: A | Discharge status: Home / Self Care | Payer: Medicare Other | Source: Ambulatory Visit | Attending: Radiation Oncology | Admitting: Radiation Oncology

## 2013-12-03 ENCOUNTER — Encounter: Payer: Self-pay | Admitting: Radiation Oncology

## 2013-12-03 VITALS — BP 134/74 | HR 61 | Temp 98.8°F | Resp 20 | Ht 73.0 in | Wt 246.9 lb

## 2013-12-03 DIAGNOSIS — Z79899 Other long term (current) drug therapy: Secondary | ICD-10-CM | POA: Insufficient documentation

## 2013-12-03 DIAGNOSIS — N529 Male erectile dysfunction, unspecified: Secondary | ICD-10-CM | POA: Insufficient documentation

## 2013-12-03 DIAGNOSIS — I10 Essential (primary) hypertension: Secondary | ICD-10-CM | POA: Insufficient documentation

## 2013-12-03 DIAGNOSIS — Z981 Arthrodesis status: Secondary | ICD-10-CM | POA: Diagnosis not present

## 2013-12-03 DIAGNOSIS — M109 Gout, unspecified: Secondary | ICD-10-CM | POA: Insufficient documentation

## 2013-12-03 DIAGNOSIS — C61 Malignant neoplasm of prostate: Secondary | ICD-10-CM | POA: Insufficient documentation

## 2013-12-03 HISTORY — DX: Gout, unspecified: M10.9

## 2013-12-03 HISTORY — DX: Malignant neoplasm of prostate: C61

## 2013-12-03 NOTE — Progress Notes (Signed)
Please see the Nurse Progress Note in the MD Initial Consult Encounter for this patient. 

## 2013-12-03 NOTE — Progress Notes (Addendum)
[facility] NEW PATIENT EVALUATION  Name: Lance Richardson MRN: [id number]  Date:   12/03/2013           DOB: Aug 09, 1956  Status: outpatient   CC: Lance Copa, MD  Dr. Rolan Richardson   REFERRING PHYSICIAN: Dr. Rolan Richardson  DIAGNOSIS: Stage TI C. favorable risk adenocarcinoma prostate   HISTORY OF PRESENT ILLNESS:  Lance Richardson is a 58 y.o. male who is seen today through the courtesy of Dr. Jasmine Richardson for discussion of radiation therapy in the management of his stage TI C. favorable risk adenocarcinoma prostate. He was noted to have an elevated PSA of 6.95 on 08/27/2013 through Dr. Aura Richardson. He was seen by Dr. Jasmine Richardson with a repeat PSA on 09/25/2013 of 7.94. He underwent ultrasound-guided biopsies in 11/14/2013 with the finding of Gleason 6 (3+3) involving 70% of one core from the right apex. 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 His gland volume is 37 cc. He is doing well from a GU and GI standpoint. He does have erectile dysfunction  for which he takes Cialis as needed.  PREVIOUS RADIATION THERAPY: No   PAST MEDICAL HISTORY:  has a past medical history of No pertinent past medical history; Hypertension; DDD (degenerative disc disease); Prostate cancer (11/14/13); Depression; and Gout.     PAST SURGICAL HISTORY:  Past Surgical History  Procedure Laterality Date  . Lumbar fusion  2010  . Knee arthroscopy  2001    right meniscus tear  . Colonoscopy    . Mass excision  08/26/2012    Procedure: EXCISION MASS;  Surgeon: Lance Polio, MD;  Location: [place];  Service: Plastics;  Laterality: Left;  excis mass left breast plastic closure   . Prostate biopsy  11/18/13     FAMILY HISTORY: family history includes Cancer in his sister; Heart attack in his father; Heart disease in his sister. His father died of a heart attack at 55. His  mother died from myeloma at 43. No family history of prostate cancer. He has one older, and one younger brother.   SOCIAL HISTORY:  reports that he has never smoked. He does not have any smokeless tobacco history on file. He reports that he does not drink alcohol or use illicit drugs. Married, 86-year-old son. His wife works as a Therapist, sports. He is on disability secondary to a back fusion and chronic pain.   ALLERGIES: Review of patient's allergies indicates no known allergies.   MEDICATIONS:  Current Outpatient Prescriptions  Medication Sig Dispense Refill  . lisinopril-hydrochlorothiazide (PRINZIDE,ZESTORETIC) 10-12.5 MG per tablet Take 1 tablet by mouth daily.      . Misc Natural Products (PUMPKIN SEED OIL) CAPS Take 1,000 capsules by mouth daily.      . Misc Natural Products (TART CHERRY ADVANCED PO) Take by mouth.      . MULTIPLE VITAMIN PO Take 1 tablet by mouth daily. Ester mega men      . OVER THE COUNTER MEDICATION Take 504 mg by mouth 2 (two) times daily. Uricelax, for gout capsule from internet      . OVER THE COUNTER MEDICATION Take 1 capsule by mouth daily as needed. Prostate & virility  vitamin      . tadalafil (CIALIS) 5 MG tablet Take 5 mg by mouth daily as needed for erectile dysfunction.       No current facility-administered medications  for this encounter.     REVIEW OF SYSTEMS:  Pertinent items are noted in HPI.    PHYSICAL EXAM:  height is 6\' 1"  (1.854 m) and weight is 246 lb 14.4 oz (111.993 kg). His oral temperature is 98.8 F (37.1 C). His blood pressure is 134/74 and his pulse is 61. His respiration is 20.    Alert and oriented 58 year old after American male appearing his stated age. He declined examination today.  LABORATORY DATA:  Lab Results  Component Value Date   WBC 6.9 03/13/2009   HGB 12.1* 08/26/2012   HCT 27.5* 03/13/2009   MCV 85.7 03/13/2009   PLT 122* 03/13/2009   Lab Results  Component Value Date   NA 140 08/22/2012   K 4.5 08/22/2012   CL 103  08/22/2012   CO2 25 08/22/2012   No results found for this basename: ALT, AST, GGT, ALKPHOS, BILITOT   PSA 7.94 from 09/25/2013   IMPRESSION: Stage TI C. favorable risk adenocarcinoma prostate. I explained to the patient and his wife, by speaker phone, that his prognosis is related to his stage, PSA level, and Gleason score. All are favorable. We discussed surgery versus close surveillance/observation, and radiation therapy. In view of his age, I do not recommend close surveillance/observation. Radiation therapy options include seed implantation alone or 8 weeks of external beam/IMRT. We discussed the potential acute and late toxicities of radiation therapy. I told the patient and his wife that I would favor surgery considering his relatively young age even though we can expect a similar cure rate with radiation therapy. Surgery would give [place] a pathologic stage and he would know if he is in clinical remission much sooner compared to waiting 1-2 years following radiation therapy. He will think things over and get back in touch with me if he wants to consider radiation therapy.   PLAN: As discussed above.  I spent 60 minutes minutes face to face with the patient and more than 50% of that time was spent in counseling and/or coordination of care.

## 2013-12-11 ENCOUNTER — Encounter: Payer: Self-pay | Admitting: *Deleted

## 2013-12-11 NOTE — Progress Notes (Signed)
[facility] Psychosocial Distress Screening Clinical Social Work  Clinical Social Work was referred by distress screening protocol.  The patient scored a 6 on the Psychosocial Distress Thermometer which indicates moderate distress. Clinical Social Worker phoned Pt to assess for distress and other psychosocial needs. Pt eager to talk with CSW and expressed very appropriate fears and concerns. CSW discussed resources and prostate support group. Pt very relieved after speaking with CSW and plans to attend group, follow up with CSW next week after his lap appt. Pt aware to phone CSW as needed.    Clinical Social Worker follow up needed: yes  If yes, follow up plan: CSW will continue to follow and will be available as needed. Pt wants to meet with CSW next week.  Loren Racer, LCSW Clinical Social Worker [facility] Wednesday, Thursday and Friday Phone: [phone] Fax: [phone]

## 2014-01-02 DIAGNOSIS — C61 Malignant neoplasm of prostate: Secondary | ICD-10-CM | POA: Diagnosis not present

## 2014-01-20 DIAGNOSIS — C61 Malignant neoplasm of prostate: Secondary | ICD-10-CM | POA: Diagnosis not present

## 2014-05-26 ENCOUNTER — Other Ambulatory Visit: Payer: Self-pay | Admitting: Urology

## 2014-06-24 DIAGNOSIS — C61 Malignant neoplasm of prostate: Secondary | ICD-10-CM | POA: Diagnosis not present

## 2014-07-09 DIAGNOSIS — L819 Disorder of pigmentation, unspecified: Secondary | ICD-10-CM | POA: Diagnosis not present

## 2014-07-15 ENCOUNTER — Encounter (HOSPITAL_COMMUNITY): Payer: Self-pay | Admitting: Pharmacy Technician

## 2014-07-16 NOTE — Patient Instructions (Signed)
Elijahjames DASCHEL ROUGHTON  07/16/2014                           YOUR PROCEDURE IS SCHEDULED ON:  07/23/14               ENTER THRU [facility] MAIN HOSPITAL ENTRANCE AND                            FOLLOW  SIGNS TO SHORT STAY CENTER                 ARRIVE AT SHORT STAY AT: 9:00 am               CALL THIS NUMBER IF ANY PROBLEMS THE DAY OF SURGERY :               [phone]                                REMEMBER:   Do not eat food or drink liquids AFTER MIDNIGHT                  Take these medicines the morning of surgery with               A SIPS OF WATER :         Do not wear jewelry, make-up   Do not wear lotions, powders, or perfumes.   Do not shave legs or underarms 12 hrs. before surgery (men may shave face)  Do not bring valuables to the hospital.  Contacts, dentures or bridgework may not be worn into surgery.  Leave suitcase in the car. After surgery it may be brought to your room.  For patients admitted to the hospital more than one night, checkout time is            11:00 AM                                                         ________________________________________________________________________                                                                [place]  Before surgery, you can play an important role.  Because skin is not sterile, your skin needs to be as free of germs as possible.  You can reduce the number of germs on your skin by washing with CHG (chlorahexidine gluconate) soap before surgery.  CHG is an antiseptic cleaner which kills germs and bonds with the skin to continue killing germs even after washing. Please DO NOT use if you have an allergy to CHG or antibacterial soaps.  If your skin becomes reddened/irritated stop using the CHG and inform your nurse when you arrive at Short Stay. Do not shave (including legs and underarms) for at least 48 hours prior to the first CHG shower.  You may shave your face. Please  follow these instructions carefully:  1.  Shower with CHG Soap the night before surgery and the  morning of Surgery.   2.  If you choose to wash your hair, wash your hair first as usual with your  normal  Shampoo.   3.  After you shampoo, rinse your hair and body thoroughly to remove the  shampoo.                                         4.  Use CHG as you would any other liquid soap.  You can apply chg directly  to the skin and wash . Gently wash with scrungie or clean wascloth    5.  Apply the CHG Soap to your body ONLY FROM THE NECK DOWN.   Do not use on open                           Wound or open sores. Avoid contact with eyes, ears mouth and genitals (private parts).                        Genitals (private parts) with your normal soap.              6.  Wash thoroughly, paying special attention to the area where your surgery  will be performed.   7.  Thoroughly rinse your body with warm water from the neck down.   8.  DO NOT shower/wash with your normal soap after using and rinsing off  the CHG Soap .                9.  Pat yourself dry with a clean towel.             10.  Wear clean pajamas.             11.  Place clean sheets on your bed the night of your first shower and do not  sleep with pets.  Day of Surgery : Do not apply any lotions/deodorants the morning of surgery.  Please wear clean clothes to the hospital/surgery center.  FAILURE TO FOLLOW THESE INSTRUCTIONS MAY RESULT IN THE CANCELLATION OF YOUR SURGERY    PATIENT SIGNATURE_________________________________  ______________________________________________________________________     Adam Phenix  An incentive spirometer is a tool that can help keep your lungs clear and active. This tool measures how well you are filling your lungs with each breath. Taking long deep breaths may help reverse or decrease the chance of developing breathing (pulmonary) problems (especially infection) following:  A long  period of time when you are unable to move or be active. BEFORE THE PROCEDURE   If the spirometer includes an indicator to show your best effort, your nurse or respiratory therapist will set it to a desired goal.  If possible, sit up straight or lean slightly forward. Try not to slouch.  Hold the incentive spirometer in an upright position. INSTRUCTIONS FOR USE  1. Sit on the edge of your bed if possible, or sit up as far as you can in bed or on a chair. 2. Hold the incentive spirometer in an upright position. 3. Breathe out normally. 4. Place the mouthpiece in your mouth and seal your lips tightly around it. 5. Breathe in slowly and as deeply as possible, raising the piston or the ball toward the top  of the column. 6. Hold your breath for 3-5 seconds or for as long as possible. Allow the piston or ball to fall to the bottom of the column. 7. Remove the mouthpiece from your mouth and breathe out normally. 8. Rest for a few seconds and repeat Steps 1 through 7 at least 10 times every 1-2 hours when you are awake. Take your time and take a few normal breaths between deep breaths. 9. The spirometer may include an indicator to show your best effort. Use the indicator as a goal to work toward during each repetition. 10. After each set of 10 deep breaths, practice coughing to be sure your lungs are clear. If you have an incision (the cut made at the time of surgery), support your incision when coughing by placing a pillow or rolled up towels firmly against it. Once you are able to get out of bed, walk around indoors and cough well. You may stop using the incentive spirometer when instructed by your caregiver.  RISKS AND COMPLICATIONS  Take your time so you do not get dizzy or light-headed.  If you are in pain, you may need to take or ask for pain medication before doing incentive spirometry. It is harder to take a deep breath if you are having pain. AFTER USE  Rest and breathe slowly and  easily.  It can be helpful to keep track of a log of your progress. Your caregiver can provide you with a simple table to help with this. If you are using the spirometer at home, follow these instructions: [address] IF:   You are having difficultly using the spirometer.  You have trouble using the spirometer as often as instructed.  Your pain medication is not giving enough relief while using the spirometer.  You develop fever of 100.5 F (38.1 C) or higher. SEEK IMMEDIATE MEDICAL CARE IF:   You cough up bloody sputum that had not been present before.  You develop fever of 102 F (38.9 C) or greater.  You develop worsening pain at or near the incision site. MAKE SURE YOU:   Understand these instructions.  Will watch your condition.  Will get help right away if you are not doing well or get worse. Document Released: 03/05/2007 Document Revised: 01/15/2012 Document Reviewed: 05/06/2007 [facility] Patient Information 2014 [place], [place].   ________________________________________________________________________

## 2014-07-20 ENCOUNTER — Encounter (HOSPITAL_COMMUNITY)
Admission: RE | Admit: 2014-07-20 | Discharge: 2014-07-20 | Disposition: A | Discharge status: Home / Self Care | Payer: Medicare Other | Source: Ambulatory Visit | Attending: Urology | Admitting: Urology

## 2014-07-20 ENCOUNTER — Other Ambulatory Visit (HOSPITAL_COMMUNITY): Payer: Self-pay | Admitting: *Deleted

## 2014-07-20 DIAGNOSIS — Z31448 Encounter for other genetic testing of male for procreative management: Secondary | ICD-10-CM | POA: Diagnosis not present

## 2014-07-20 DIAGNOSIS — Z113 Encounter for screening for infections with a predominantly sexual mode of transmission: Secondary | ICD-10-CM | POA: Diagnosis not present

## 2014-07-20 DIAGNOSIS — Z01812 Encounter for preprocedural laboratory examination: Secondary | ICD-10-CM | POA: Diagnosis not present

## 2014-07-20 DIAGNOSIS — Z1159 Encounter for screening for other viral diseases: Secondary | ICD-10-CM | POA: Diagnosis not present

## 2014-07-20 DIAGNOSIS — Z118 Encounter for screening for other infectious and parasitic diseases: Secondary | ICD-10-CM | POA: Diagnosis not present

## 2014-08-06 ENCOUNTER — Encounter: Payer: Self-pay | Admitting: General Surgery

## 2014-09-11 ENCOUNTER — Other Ambulatory Visit: Payer: Self-pay | Admitting: Urology

## 2014-09-22 DIAGNOSIS — M109 Gout, unspecified: Secondary | ICD-10-CM | POA: Diagnosis not present

## 2014-09-22 DIAGNOSIS — Z8739 Personal history of other diseases of the musculoskeletal system and connective tissue: Secondary | ICD-10-CM | POA: Diagnosis not present

## 2014-10-28 ENCOUNTER — Other Ambulatory Visit (HOSPITAL_COMMUNITY): Payer: Self-pay | Admitting: *Deleted

## 2014-10-28 DIAGNOSIS — N5201 Erectile dysfunction due to arterial insufficiency: Secondary | ICD-10-CM | POA: Diagnosis not present

## 2014-10-28 DIAGNOSIS — C61 Malignant neoplasm of prostate: Secondary | ICD-10-CM | POA: Diagnosis not present

## 2014-10-28 NOTE — Patient Instructions (Addendum)
Lance Richardson  10/28/2014   Your procedure is scheduled on: 11/12/13   Report to [facility]  Entrance and follow signs to               [place] at 9:45  AM.   Call this number if you have problems the morning of surgery [phone]   Remember:  Do not eat food or drink liquids :After Midnight.     Take these medicines the morning of surgery with A SIP OF WATER: NONE                               You may not have any metal on your body including hair pins and              piercings  Do not wear jewelry, make-up, lotions, powders or perfumes.             Do not wear nail polish.  Do not shave  48 hours prior to surgery.              Men may shave face and neck.   Do not bring valuables to the hospital. Lance Richardson.  Contacts, dentures or bridgework may not be worn into surgery.  Leave suitcase in the car. After surgery it may be brought to your room.     Patients discharged the day of surgery will not be allowed to drive home.  Name and phone number of your driver:  Special Instructions: N/A              Please read over the following fact sheets you were given: _____________________________________________________________________                                                     Lance Richardson  Before surgery, you can play an important role.  Because skin is not sterile, your skin needs to be as free of germs as possible.  You can reduce the number of germs on your skin by washing with CHG (chlorahexidine gluconate) soap before surgery.  CHG is an antiseptic cleaner which kills germs and bonds with the skin to continue killing germs even after washing. Please DO NOT use if you have an allergy to CHG or antibacterial soaps.  If your skin becomes reddened/irritated stop using the CHG and inform your nurse when you arrive at Short Stay. Do not shave (including legs and  underarms) for at least 48 hours prior to the first CHG shower.  You may shave your face. Please follow these instructions carefully:   1.  Shower with CHG Soap the night before surgery and the  morning of Surgery.   2.  If you choose to wash your hair, wash your hair first as usual with your  normal  Shampoo.   3.  After you shampoo, rinse your hair and body thoroughly to remove the  shampoo.  4.  Use CHG as you would any other liquid soap.  You can apply chg directly  to the skin and wash . Gently wash with scrungie or clean wascloth    5.  Apply the CHG Soap to your body ONLY FROM THE NECK DOWN.   Do not use on open                           Wound or open sores. Avoid contact with eyes, ears mouth and genitals (private parts).                        Genitals (private parts) with your normal soap.              6.  Wash thoroughly, paying special attention to the area where your surgery  will be performed.   7.  Thoroughly rinse your body with warm water from the neck down.   8.  DO NOT shower/wash with your normal soap after using and rinsing off  the CHG Soap .                9.  Pat yourself dry with a clean towel.             10.  Wear clean pajamas.             11.  Place clean sheets on your bed the night of your first shower and do not  sleep with pets.  Day of Surgery : Do not apply any lotions/deodorants the morning of surgery.  Please wear clean clothes to the hospital/surgery center.  FAILURE TO FOLLOW THESE INSTRUCTIONS MAY RESULT IN THE CANCELLATION OF YOUR SURGERY    PATIENT SIGNATURE_________________________________  ______________________________________________________________________     Lance Richardson  An incentive spirometer is a tool that can help keep your lungs clear and active. This tool measures how well you are filling your lungs with each breath. Taking long deep breaths may help reverse or decrease  the chance of developing breathing (pulmonary) problems (especially infection) following:  A long period of time when you are unable to move or be active. BEFORE THE PROCEDURE   If the spirometer includes an indicator to show your best effort, your nurse or respiratory therapist will set it to a desired goal.  If possible, sit up straight or lean slightly forward. Try not to slouch.  Hold the incentive spirometer in an upright position. INSTRUCTIONS FOR USE  1. Sit on the edge of your bed if possible, or sit up as far as you can in bed or on a chair. 2. Hold the incentive spirometer in an upright position. 3. Breathe out normally. 4. Place the mouthpiece in your mouth and seal your lips tightly around it. 5. Breathe in slowly and as deeply as possible, raising the piston or the ball toward the top of the column. 6. Hold your breath for 3-5 seconds or for as long as possible. Allow the piston or ball to fall to the bottom of the column. 7. Remove the mouthpiece from your mouth and breathe out normally. 8. Rest for a few seconds and repeat Steps 1 through 7 at least 10 times every 1-2 hours when you are awake. Take your time and take a few normal breaths between deep breaths. 9. The spirometer may include an indicator to show your best effort. Use the indicator as a goal to work toward during  each repetition. 10. After each set of 10 deep breaths, practice coughing to be sure your lungs are clear. If you have an incision (the cut made at the time of surgery), support your incision when coughing by placing a pillow or rolled up towels firmly against it. Once you are able to get out of bed, walk around indoors and cough well. You may stop using the incentive spirometer when instructed by your caregiver.  RISKS AND COMPLICATIONS  Take your time so you do not get dizzy or light-headed.  If you are in pain, you may need to take or ask for pain medication before doing incentive spirometry. It is  harder to take a deep breath if you are having pain. AFTER USE  Rest and breathe slowly and easily.  It can be helpful to keep track of a log of your progress. Your caregiver can provide you with a simple table to help with this. If you are using the spirometer at home, follow these instructions: Lance Richardson IF:   You are having difficultly using the spirometer.  You have trouble using the spirometer as often as instructed.  Your pain medication is not giving enough relief while using the spirometer.  You develop fever of 100.5 F (38.1 C) or higher. SEEK IMMEDIATE MEDICAL CARE IF:   You cough up bloody sputum that had not been present before.  You develop fever of 102 F (38.9 C) or greater.  You develop worsening pain at or near the incision site. MAKE SURE YOU:   Understand these instructions.  Will watch your condition.  Will get help right away if you are not doing well or get worse. Document Released: 03/05/2007 Document Revised: 01/15/2012 Document Reviewed: 05/06/2007 [facility] Patient Information 2014 [place], [place].   ________________________________________________________________________

## 2014-11-02 ENCOUNTER — Ambulatory Visit (HOSPITAL_COMMUNITY)
Admission: RE | Admit: 2014-11-02 | Discharge: 2014-11-02 | Disposition: A | Discharge status: Home / Self Care | Payer: Medicare Other | Source: Ambulatory Visit | Attending: Anesthesiology | Admitting: Anesthesiology

## 2014-11-02 ENCOUNTER — Encounter (HOSPITAL_COMMUNITY)
Admission: RE | Admit: 2014-11-02 | Discharge: 2014-11-02 | Disposition: A | Discharge status: Home / Self Care | Payer: Medicare Other | Source: Ambulatory Visit | Attending: Urology | Admitting: Urology

## 2014-11-02 ENCOUNTER — Encounter (HOSPITAL_COMMUNITY): Payer: Self-pay

## 2014-11-02 ENCOUNTER — Other Ambulatory Visit: Payer: Self-pay | Admitting: Urology

## 2014-11-02 DIAGNOSIS — Z01818 Encounter for other preprocedural examination: Secondary | ICD-10-CM | POA: Insufficient documentation

## 2014-11-02 DIAGNOSIS — C61 Malignant neoplasm of prostate: Secondary | ICD-10-CM | POA: Diagnosis not present

## 2014-11-02 DIAGNOSIS — Z01812 Encounter for preprocedural laboratory examination: Secondary | ICD-10-CM | POA: Diagnosis not present

## 2014-11-02 DIAGNOSIS — Z01810 Encounter for preprocedural cardiovascular examination: Secondary | ICD-10-CM | POA: Diagnosis not present

## 2014-11-02 DIAGNOSIS — M6281 Muscle weakness (generalized): Secondary | ICD-10-CM | POA: Diagnosis not present

## 2014-11-02 LAB — CBC
HCT: 39.7 % (ref 39.0–52.0)
Hemoglobin: 12.8 g/dL — ABNORMAL LOW (ref 13.0–17.0)
MCH: 27.3 pg (ref 26.0–34.0)
MCHC: 32.2 g/dL (ref 30.0–36.0)
MCV: 84.6 fL (ref 78.0–100.0)
Platelets: 166 10*3/uL (ref 150–400)
RBC: 4.69 MIL/uL (ref 4.22–5.81)
RDW: 13.9 % (ref 11.5–15.5)
WBC: 3.7 10*3/uL — ABNORMAL LOW (ref 4.0–10.5)

## 2014-11-02 LAB — BASIC METABOLIC PANEL
Anion gap: 7 (ref 5–15)
BUN: 22 mg/dL (ref 6–23)
CALCIUM: 8.9 mg/dL (ref 8.4–10.5)
CO2: 24 mmol/L (ref 19–32)
CREATININE: 1.52 mg/dL — AB (ref 0.50–1.35)
Chloride: 106 mEq/L (ref 96–112)
GFR calc Af Amer: 57 mL/min — ABNORMAL LOW (ref >90)
GFR calc non Af Amer: 49 mL/min — ABNORMAL LOW (ref >90)
GLUCOSE: 91 mg/dL (ref 70–99)
Potassium: 4.4 mmol/L (ref 3.5–5.1)
Sodium: 137 mmol/L (ref 135–145)

## 2014-11-02 LAB — NO BLOOD PRODUCTS

## 2014-11-02 NOTE — Progress Notes (Signed)
   11/02/14 0934  OBSTRUCTIVE SLEEP APNEA  Have you ever been diagnosed with sleep apnea through a sleep study? No  Do you snore loudly (loud enough to be heard through closed doors)?  0  Do you often feel tired, fatigued, or sleepy during the daytime? 0  Has anyone observed you stop breathing during your sleep? 0  Do you have, or are you being treated for high blood pressure? 1  BMI more than 35 kg/m2? 0  Age over 58 years old? 1  Neck circumference greater than 40 cm/16 inches? 1  Gender: 1  Obstructive Sleep Apnea Score 4  Score 4 or greater  Results sent to PCP

## 2014-11-11 NOTE — H&P (Signed)
History of Present Illness Lance Richardson is a 59 year old gentleman who was noted to have an elevated PSA of 7.94 prompting a prostate needle biopsy on 11/14/13 by Dr. Jasmine December. 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 He has no family history of prostate cancer and has minimal comorbid conditions. He has been thoroughly counseled about his treatment options by Dr. Jasmine December and Dr. Valere Dross and has elected to proceed with surgical treatment. He does have spiritual beliefs which have led him to wish to avoid any blood product transfusions. He has elected to proceed with surgical treatment of his prostate cancer.    His PMH is significant for chronic kidney disease (baseline Cr 1.69, followed by Dr. Marval Regal), hypertension, and dyslipidemia.    TNM stage: cT1c Nx Mx  PSA: 7.94  Gleason score: 3+3=6  Biopsy (11/14/13): 1/12 cores positive -- R apex (70%, 3+3=6, PNI)  Prostate volume: Vol 36.7 cc    Nomogram  OC disease: 77%  EPE: 17%  SVI: 1%  LNI: 1%  PFS (surgery): 92% at 5 years, 86% at 10 years    Urinary function: He denies baseline urinary symptoms. IPSS is 1.  Erectile function: He does have mild erectile dysfunction and typically utilizes Cialis 5 mg but just on an as-needed basis. SHIM score is 18. His wife is much younger than him and they may potentially be interested in having more children in the future. Therefore, fertility is a concern and he was counseled about the option of sperm banking.    Interval history:    He follows up today in anticipation of undergoing surgical treatment of his prostate cancer in January. He had originally been scheduled this summer and again this fall but canceled those procedures in order to be able to donate sperm prior to surgery. He now has banked sperm and continues to try to conceive naturally with his wife although has as of yet been unsuccessful. He follows up today to discuss further  questions prior to his surgery. He has noted some increased erectile dysfunction since his last visit and does work last oral medical therapy. He has been unable to afford brand name Viagra.     Past Medical History Problems  1. History of Arthritis 2. History of Gout (M10.9) 3. History of depression (Z86.59) 4. History of hypertension (Z86.79)  Surgical History Problems  1. History of Arthroscopy Knee 2. History of Back Surgery  Current Meds 1. Cialis 5 MG Oral Tablet;  Therapy: (Recorded:20Nov2014) to Recorded 2. Colcrys 0.6 MG Oral Tablet;  Therapy: [phone] to Recorded 3. Flaxseed Oil CAPS;  Therapy: (Recorded:27Feb2015) to Recorded 4. Lisinopril-Hydrochlorothiazide 10-12.5 MG Oral Tablet;  Therapy: 03Oct2012 to Recorded 5. Tart Cherry Advanced CAPS;  Therapy: (Recorded:20Nov2014) to Recorded 6. Viagra 100 MG Oral Tablet; Take one table one hour prior to intercourse prn;  Therapy: [id number] to (Last Rx:25Nov2015)  Requested for: 25Nov2015 Ordered  Allergies Medication  1. No Known Drug Allergies  Family History Problems  1. Family history of Acute Myocardial Infarction : Father 2. Family history of Colon Cancer : Sister 3. Family history of Death In The Family Father : Father   62-MI 4. Family history of Death In The Family Mother : Mother   16- multiple myleloma 5. Family history of Family Health Status Number Of Children   2 sons & 1 daughter 32. Family history of Heart Disease : Sister 7. Family history of Multiple Myeloma : Mother  Social History Problems  1. Denied: History of Alcohol Use 2. Marital History - Currently Married 3. Never A Smoker 4. Physical Disability Affecting Ability To Work  Vitals Vital Signs [Data Includes: Last 1 Day]  Recorded: 23Dec2015 12:41PM  Height: 6 ft 1 in Weight: 245 lb  BMI Calculated: 32.32 BSA Calculated: 2.35 Blood Pressure: 117 / 73 Heart Rate: 55  Physical Exam Constitutional: Well nourished and well  developed . No acute distress.  ENT:. The ears and nose are normal in appearance.  Neck: The appearance of the neck is normal and no neck mass is present.  Pulmonary: No respiratory distress, normal respiratory rhythm and effort and clear bilateral breath sounds.  Cardiovascular: Heart rate and rhythm are normal . No peripheral edema.  Abdomen: The abdomen is soft and nontender. No masses are palpated. No CVA tenderness. No hernias are palpable. No hepatosplenomegaly noted.  Rectal: Rectal exam demonstrates normal sphincter tone, no tenderness and no masses. Prostate size is estimated to be 40 g. The prostate has no nodularity and is not tender. The left seminal vesicle is nonpalpable. The right seminal vesicle is nonpalpable. The perineum is normal on inspection.  Lymphatics: The femoral and inguinal nodes are not enlarged or tender.  Skin: Normal skin turgor, no visible rash and no visible skin lesions.  Neuro/Psych:. Mood and affect are appropriate.    Results/Data Urine [Data Includes: Last 1 Day]   [id number]  COLOR YELLOW   APPEARANCE CLEAR   SPECIFIC GRAVITY 1.015   pH 5.5   GLUCOSE NEG mg/dL  BILIRUBIN NEG   KETONE NEG mg/dL  BLOOD NEG   PROTEIN NEG mg/dL  UROBILINOGEN 0.2 mg/dL  NITRITE NEG   LEUKOCYTE ESTERASE NEG   PSA Flowsheet [Data Includes: All]   [id number] [id number] [id number]  PSA 9.81 ng/mL 7.94 ng/mL 1.40 ng/mL 5.07 ng/mL  Free PSA  1.32 ng/mL 0.33 NG/ML 0.56 NG/ML  % Free PSA  17 % 23.6 %PSA 11.0 %PSA  Assessment Assessed  1. Prostate cancer (C61) 2. Erectile dysfunction due to arterial insufficiency (N52.01)  Plan Health Maintenance  1. UA With REFLEX; [Do Not Release]; Status:Complete;   Done: [id number] 12:26PM Prostate cancer  2. Follow-up Keep Future Appt Office  Follow-up  Status: Hold For - Appointment  Requested  for: [phone] 3. Follow-up Keep Future Appt Office  Follow-up  Status: Hold For - Appointment  Requested  for: [phone] 4.  PT/OT Referral Referral  Referral  Status: Hold For - Appointment,PreCert,Date of  Service,Physical Therapy  Requested for: 23Dec2015 5. PSA; Status:In Progress - Specimen/Data Collected;   Done: [id number] 6. VENIPUNCTURE; Status:In Progress - Specimen/Data Collected;   Done: [id number]  Discussion/Summary 1. Prostate cancer: He has proceeded with banking sperm and is now ready to proceed with surgical treatment of his prostate cancer.   The patient was counseled about the natural history of prostate cancer and the standard treatment options that are available for prostate cancer. It was explained to him how his age and life expectancy, clinical stage, Gleason score, and PSA affect his prognosis, the decision to proceed with additional staging studies, as well as how that information influences recommended treatment strategies. We discussed the roles for active surveillance, radiation therapy, surgical therapy, androgen deprivation, as well as ablative therapy options for the treatment of prostate cancer as appropriate to his individual cancer situation. We discussed the risks and benefits of these options with regard to their impact on cancer control and also in terms of potential adverse events, complications, and impact on quiality  of life particularly related to urinary, bowel, and sexual function. The patient was encouraged to ask questions throughout the discussion today and all questions were answered to his stated satisfaction. In addition, the patient was provided with and/or directed to appropriate resources and literature for further education about prostate cancer and treatment options.   We discussed surgical therapy for prostate cancer including the different available surgical approaches. We discussed, in detail, the risks and expectations of surgery with regard to cancer control, urinary control, and erectile function as well as the expected postoperative recovery process. Additional risks of  surgery including but not limited to bleeding, infection, hernia formation, nerve damage, lymphocele formation, bowel/rectal injury potentially necessitating colostomy, damage to the urinary tract resulting in urine leakage, urethral stricture, and the cardiopulmonary risks such as myocardial infarction, stroke, death, venothromboembolism, etc. were explained. The risk of open surgical conversion for robotic/laparoscopic prostatectomy was also discussed.     His PSA will be checked today. Assuming that it has not significantly increased, he will proceed with surgical treatment in January. His PSA has increased substantially, we will discuss his staging studies are indicated and whether he should undergo a lymphadenectomy. He otherwise will be scheduled for a bilateral nerve sparing robotic-assisted laparoscopic radical prostatectomy. He has not yet started physical therapy and we will see if we can possibly get an internal appointment prior to his surgery which may be difficult considering the holidays.    2. Erectile dysfunction: This has progressed somewhat. He was provided samples for Stendra 200 mg prn. He has been instructed on proper use of potential side effects.    Cc: Dr. Aura Dials  A total of 45 minutes were spent in the overall care of the patient today with 35 minutes in direct face to face consultation.    Signatures Electronically signed by : Raynelle Bring, M.D.; Oct 28 2014  1:13PM EST

## 2014-11-12 ENCOUNTER — Inpatient Hospital Stay (HOSPITAL_COMMUNITY)
Admission: RE | Admit: 2014-11-12 | Discharge: 2014-11-13 | DRG: 708 | Disposition: A | Discharge status: Home / Self Care | Payer: Medicare Other | Source: Ambulatory Visit | Attending: Urology | Admitting: Urology

## 2014-11-12 ENCOUNTER — Encounter (HOSPITAL_COMMUNITY)
Admission: RE | Disposition: A | Discharge status: Home / Self Care | Payer: Self-pay | Source: Ambulatory Visit | Attending: Urology

## 2014-11-12 ENCOUNTER — Inpatient Hospital Stay (HOSPITAL_COMMUNITY): Payer: Medicare Other | Admitting: Anesthesiology

## 2014-11-12 ENCOUNTER — Encounter (HOSPITAL_COMMUNITY): Payer: Self-pay | Admitting: *Deleted

## 2014-11-12 DIAGNOSIS — M1990 Unspecified osteoarthritis, unspecified site: Secondary | ICD-10-CM | POA: Diagnosis present

## 2014-11-12 DIAGNOSIS — N189 Chronic kidney disease, unspecified: Secondary | ICD-10-CM | POA: Diagnosis present

## 2014-11-12 DIAGNOSIS — Z79899 Other long term (current) drug therapy: Secondary | ICD-10-CM | POA: Diagnosis not present

## 2014-11-12 DIAGNOSIS — M109 Gout, unspecified: Secondary | ICD-10-CM | POA: Diagnosis present

## 2014-11-12 DIAGNOSIS — N5201 Erectile dysfunction due to arterial insufficiency: Secondary | ICD-10-CM | POA: Diagnosis present

## 2014-11-12 DIAGNOSIS — F329 Major depressive disorder, single episode, unspecified: Secondary | ICD-10-CM | POA: Diagnosis present

## 2014-11-12 DIAGNOSIS — E785 Hyperlipidemia, unspecified: Secondary | ICD-10-CM | POA: Diagnosis not present

## 2014-11-12 DIAGNOSIS — C61 Malignant neoplasm of prostate: Secondary | ICD-10-CM | POA: Diagnosis not present

## 2014-11-12 DIAGNOSIS — I129 Hypertensive chronic kidney disease with stage 1 through stage 4 chronic kidney disease, or unspecified chronic kidney disease: Secondary | ICD-10-CM | POA: Diagnosis present

## 2014-11-12 DIAGNOSIS — M549 Dorsalgia, unspecified: Secondary | ICD-10-CM | POA: Diagnosis not present

## 2014-11-12 HISTORY — PX: ROBOT ASSISTED LAPAROSCOPIC RADICAL PROSTATECTOMY: SHX5141

## 2014-11-12 HISTORY — PX: LYMPHADENECTOMY: SHX5960

## 2014-11-12 LAB — HEMOGLOBIN AND HEMATOCRIT, BLOOD
HCT: 38.1 % — ABNORMAL LOW (ref 39.0–52.0)
Hemoglobin: 12.1 g/dL — ABNORMAL LOW (ref 13.0–17.0)

## 2014-11-12 SURGERY — ROBOTIC ASSISTED LAPAROSCOPIC RADICAL PROSTATECTOMY LEVEL 1
Anesthesia: General

## 2014-11-12 MED ORDER — ROCURONIUM BROMIDE 100 MG/10ML IV SOLN
INTRAVENOUS | Status: DC | PRN
  Administered 2014-11-12: 5 mg via INTRAVENOUS
  Administered 2014-11-12: 50 mg via INTRAVENOUS
  Administered 2014-11-12 (×2): 10 mg via INTRAVENOUS

## 2014-11-12 MED ORDER — ONDANSETRON HCL 4 MG/2ML IJ SOLN
INTRAMUSCULAR | Status: DC | PRN
  Administered 2014-11-12: 4 mg via INTRAVENOUS

## 2014-11-12 MED ORDER — CEFAZOLIN SODIUM 1-5 GM-% IV SOLN
1.0000 g | Freq: Three times a day (TID) | INTRAVENOUS | Status: AC
  Administered 2014-11-12 – 2014-11-13 (×2): 1 g via INTRAVENOUS
  Filled 2014-11-12 (×3): qty 50

## 2014-11-12 MED ORDER — GLYCOPYRROLATE 0.2 MG/ML IJ SOLN
INTRAMUSCULAR | Status: AC
  Filled 2014-11-12: qty 1

## 2014-11-12 MED ORDER — HYDROMORPHONE HCL 2 MG/ML IJ SOLN
INTRAMUSCULAR | Status: AC
  Filled 2014-11-12: qty 1

## 2014-11-12 MED ORDER — FENTANYL CITRATE 0.05 MG/ML IJ SOLN
INTRAMUSCULAR | Status: AC
  Filled 2014-11-12: qty 5

## 2014-11-12 MED ORDER — LIDOCAINE HCL (CARDIAC) 20 MG/ML IV SOLN
INTRAVENOUS | Status: AC
  Filled 2014-11-12: qty 5

## 2014-11-12 MED ORDER — ROCURONIUM BROMIDE 100 MG/10ML IV SOLN
INTRAVENOUS | Status: AC
  Filled 2014-11-12: qty 1

## 2014-11-12 MED ORDER — DIPHENHYDRAMINE HCL 12.5 MG/5ML PO ELIX: 12.5000 mg | ORAL_SOLUTION | Freq: Four times a day (QID) | ORAL | Status: DC | PRN

## 2014-11-12 MED ORDER — STERILE WATER FOR IRRIGATION IR SOLN
Status: DC | PRN
  Administered 2014-11-12: 3000 mL

## 2014-11-12 MED ORDER — KCL IN DEXTROSE-NACL 20-5-0.45 MEQ/L-%-% IV SOLN
INTRAVENOUS | Status: DC
  Administered 2014-11-12: 16:00:00 via INTRAVENOUS
  Administered 2014-11-13: 150 mL/h via INTRAVENOUS
  Filled 2014-11-12 (×4): qty 1000

## 2014-11-12 MED ORDER — PROPOFOL 10 MG/ML IV BOLUS
INTRAVENOUS | Status: DC | PRN
  Administered 2014-11-12: 250 mg via INTRAVENOUS

## 2014-11-12 MED ORDER — NEOSTIGMINE METHYLSULFATE 10 MG/10ML IV SOLN
INTRAVENOUS | Status: DC | PRN
  Administered 2014-11-12: 5 mg via INTRAVENOUS

## 2014-11-12 MED ORDER — ACETAMINOPHEN 325 MG PO TABS: 650.0000 mg | ORAL_TABLET | ORAL | Status: DC | PRN

## 2014-11-12 MED ORDER — LACTATED RINGERS IV SOLN
INTRAVENOUS | Status: DC | PRN
  Administered 2014-11-12 (×2): via INTRAVENOUS

## 2014-11-12 MED ORDER — MIDAZOLAM HCL 2 MG/2ML IJ SOLN
INTRAMUSCULAR | Status: AC
  Filled 2014-11-12: qty 2

## 2014-11-12 MED ORDER — KCL IN DEXTROSE-NACL 20-5-0.45 MEQ/L-%-% IV SOLN
INTRAVENOUS | Status: AC
Start: 2014-11-12 — End: 2014-11-13
  Filled 2014-11-12: qty 1000

## 2014-11-12 MED ORDER — MIDAZOLAM HCL 5 MG/5ML IJ SOLN
INTRAMUSCULAR | Status: DC | PRN
  Administered 2014-11-12: 2 mg via INTRAVENOUS

## 2014-11-12 MED ORDER — HEPARIN SODIUM (PORCINE) 1000 UNIT/ML IJ SOLN
INTRAMUSCULAR | Status: DC | PRN
  Administered 2014-11-12: 11:00:00

## 2014-11-12 MED ORDER — PROPOFOL 10 MG/ML IV BOLUS
INTRAVENOUS | Status: AC
  Filled 2014-11-12: qty 20

## 2014-11-12 MED ORDER — LACTATED RINGERS IV SOLN
INTRAVENOUS | Status: DC
  Administered 2014-11-12: 1000 mL via INTRAVENOUS

## 2014-11-12 MED ORDER — DIPHENHYDRAMINE HCL 50 MG/ML IJ SOLN: 12.5000 mg | Freq: Four times a day (QID) | INTRAMUSCULAR | Status: DC | PRN

## 2014-11-12 MED ORDER — ONDANSETRON HCL 4 MG/2ML IJ SOLN
INTRAMUSCULAR | Status: AC
  Filled 2014-11-12: qty 2

## 2014-11-12 MED ORDER — MENTHOL 3 MG MT LOZG
1.0000 | LOZENGE | OROMUCOSAL | Status: DC | PRN
  Filled 2014-11-12: qty 9

## 2014-11-12 MED ORDER — SODIUM CHLORIDE 0.9 % IJ SOLN
INTRAMUSCULAR | Status: AC
  Filled 2014-11-12: qty 10

## 2014-11-12 MED ORDER — KETOROLAC TROMETHAMINE 15 MG/ML IJ SOLN
15.0000 mg | Freq: Four times a day (QID) | INTRAMUSCULAR | Status: DC
  Administered 2014-11-12 – 2014-11-13 (×3): 15 mg via INTRAVENOUS
  Filled 2014-11-12 (×6): qty 1

## 2014-11-12 MED ORDER — EPHEDRINE SULFATE 50 MG/ML IJ SOLN
INTRAMUSCULAR | Status: AC
  Filled 2014-11-12: qty 1

## 2014-11-12 MED ORDER — BUPIVACAINE-EPINEPHRINE 0.25% -1:200000 IJ SOLN
INTRAMUSCULAR | Status: DC | PRN
  Administered 2014-11-12: 30 mL

## 2014-11-12 MED ORDER — HYDROMORPHONE HCL 1 MG/ML IJ SOLN
INTRAMUSCULAR | Status: AC
  Filled 2014-11-12: qty 1

## 2014-11-12 MED ORDER — BUPIVACAINE-EPINEPHRINE (PF) 0.25% -1:200000 IJ SOLN
INTRAMUSCULAR | Status: AC
  Filled 2014-11-12: qty 30

## 2014-11-12 MED ORDER — CIPROFLOXACIN HCL 500 MG PO TABS: 500.0000 mg | ORAL_TABLET | Freq: Two times a day (BID) | ORAL | Status: AC

## 2014-11-12 MED ORDER — HYDROCODONE-ACETAMINOPHEN 5-325 MG PO TABS: 1.0000 | ORAL_TABLET | Freq: Four times a day (QID) | ORAL | Status: AC | PRN

## 2014-11-12 MED ORDER — GLYCOPYRROLATE 0.2 MG/ML IJ SOLN
INTRAMUSCULAR | Status: AC
  Filled 2014-11-12: qty 4

## 2014-11-12 MED ORDER — SODIUM CHLORIDE 0.9 % IV BOLUS (SEPSIS)
1000.0000 mL | Freq: Once | INTRAVENOUS | Status: AC
  Administered 2014-11-12: 1000 mL via INTRAVENOUS

## 2014-11-12 MED ORDER — HYDROMORPHONE HCL 1 MG/ML IJ SOLN
INTRAMUSCULAR | Status: DC | PRN
  Administered 2014-11-12 (×5): 0.5 mg via INTRAVENOUS

## 2014-11-12 MED ORDER — PHENOL 1.4 % MT LIQD
1.0000 | OROMUCOSAL | Status: DC | PRN
  Filled 2014-11-12: qty 177

## 2014-11-12 MED ORDER — CEFAZOLIN SODIUM-DEXTROSE 2-3 GM-% IV SOLR
2.0000 g | INTRAVENOUS | Status: AC
  Administered 2014-11-12: 2 g via INTRAVENOUS

## 2014-11-12 MED ORDER — SODIUM CHLORIDE 0.9 % IR SOLN
Status: DC | PRN
  Administered 2014-11-12: 1000 mL via INTRAVESICAL

## 2014-11-12 MED ORDER — DOCUSATE SODIUM 100 MG PO CAPS
100.0000 mg | ORAL_CAPSULE | Freq: Two times a day (BID) | ORAL | Status: DC
  Administered 2014-11-12 – 2014-11-13 (×2): 100 mg via ORAL
  Filled 2014-11-12 (×2): qty 1

## 2014-11-12 MED ORDER — HEPARIN SODIUM (PORCINE) 1000 UNIT/ML IJ SOLN
INTRAMUSCULAR | Status: AC
  Filled 2014-11-12: qty 1

## 2014-11-12 MED ORDER — LIDOCAINE HCL (CARDIAC) 20 MG/ML IV SOLN
INTRAVENOUS | Status: DC | PRN
  Administered 2014-11-12: 80 mg via INTRAVENOUS

## 2014-11-12 MED ORDER — GLYCOPYRROLATE 0.2 MG/ML IJ SOLN
INTRAMUSCULAR | Status: DC | PRN
  Administered 2014-11-12: 0.2 mg via INTRAVENOUS
  Administered 2014-11-12: .8 mg via INTRAVENOUS

## 2014-11-12 MED ORDER — NEOSTIGMINE METHYLSULFATE 10 MG/10ML IV SOLN
INTRAVENOUS | Status: AC
  Filled 2014-11-12: qty 1

## 2014-11-12 MED ORDER — MORPHINE SULFATE 2 MG/ML IJ SOLN
2.0000 mg | INTRAMUSCULAR | Status: DC | PRN
  Administered 2014-11-12 – 2014-11-13 (×2): 2 mg via INTRAVENOUS
  Filled 2014-11-12 (×2): qty 1

## 2014-11-12 MED ORDER — HYDROMORPHONE HCL 1 MG/ML IJ SOLN
0.2500 mg | INTRAMUSCULAR | Status: DC | PRN
  Administered 2014-11-12 (×3): 0.5 mg via INTRAVENOUS

## 2014-11-12 MED ORDER — FENTANYL CITRATE 0.05 MG/ML IJ SOLN
INTRAMUSCULAR | Status: DC | PRN
  Administered 2014-11-12: 100 ug via INTRAVENOUS
  Administered 2014-11-12: 50 ug via INTRAVENOUS
  Administered 2014-11-12: 100 ug via INTRAVENOUS

## 2014-11-12 MED ORDER — CEFAZOLIN SODIUM-DEXTROSE 2-3 GM-% IV SOLR
INTRAVENOUS | Status: AC
  Filled 2014-11-12: qty 50

## 2014-11-12 MED ORDER — PROMETHAZINE HCL 25 MG/ML IJ SOLN: 6.2500 mg | INTRAMUSCULAR | Status: DC | PRN

## 2014-11-12 SURGICAL SUPPLY — 48 items
CABLE HIGH FREQUENCY MONO STRZ (ELECTRODE) ×4 IMPLANT
CATH FOLEY 2WAY SLVR 18FR 30CC (CATHETERS) ×4 IMPLANT
CATH ROBINSON RED A/P 16FR (CATHETERS) ×4 IMPLANT
CATH ROBINSON RED A/P 8FR (CATHETERS) ×4 IMPLANT
CATH TIEMANN FOLEY 18FR 5CC (CATHETERS) ×4 IMPLANT
CHLORAPREP W/TINT 26ML (MISCELLANEOUS) ×4 IMPLANT
CLIP LIGATING HEM O LOK PURPLE (MISCELLANEOUS) ×8 IMPLANT
CLOTH BEACON ORANGE TIMEOUT ST (SAFETY) ×4 IMPLANT
COVER SURGICAL LIGHT HANDLE (MISCELLANEOUS) ×4 IMPLANT
COVER TIP SHEARS 8 DVNC (MISCELLANEOUS) ×2 IMPLANT
COVER TIP SHEARS 8MM DA VINCI (MISCELLANEOUS) ×2
CUTTER ECHEON FLEX ENDO 45 340 (ENDOMECHANICALS) ×4 IMPLANT
DECANTER SPIKE VIAL GLASS SM (MISCELLANEOUS) IMPLANT
DRAPE SURG IRRIG POUCH 19X23 (DRAPES) ×4 IMPLANT
DRSG TEGADERM 4X4.75 (GAUZE/BANDAGES/DRESSINGS) ×4 IMPLANT
DRSG TEGADERM 6X8 (GAUZE/BANDAGES/DRESSINGS) ×8 IMPLANT
ELECT REM PT RETURN 9FT ADLT (ELECTROSURGICAL) ×4
ELECTRODE REM PT RTRN 9FT ADLT (ELECTROSURGICAL) ×2 IMPLANT
GLOVE BIO SURGEON STRL SZ 6.5 (GLOVE) ×3 IMPLANT
GLOVE BIO SURGEONS STRL SZ 6.5 (GLOVE) ×1
GLOVE BIOGEL M STRL SZ7.5 (GLOVE) ×8 IMPLANT
GOWN STRL REUS W/TWL LRG LVL3 (GOWN DISPOSABLE) ×12 IMPLANT
HOLDER FOLEY CATH W/STRAP (MISCELLANEOUS) ×4 IMPLANT
IV LACTATED RINGERS 1000ML (IV SOLUTION) ×4 IMPLANT
KIT ACCESSORY DA VINCI DISP (KITS) ×2
KIT ACCESSORY DVNC DISP (KITS) ×2 IMPLANT
LIQUID BAND (GAUZE/BANDAGES/DRESSINGS) ×4 IMPLANT
NDL SAFETY ECLIPSE 18X1.5 (NEEDLE) ×2 IMPLANT
NEEDLE HYPO 18GX1.5 SHARP (NEEDLE) ×4
PACK ROBOT UROLOGY CUSTOM (CUSTOM PROCEDURE TRAY) ×4 IMPLANT
RELOAD GREEN ECHELON 45 (STAPLE) ×4 IMPLANT
SET TUBE IRRIG SUCTION NO TIP (IRRIGATION / IRRIGATOR) ×4 IMPLANT
SOLUTION ELECTROLUBE (MISCELLANEOUS) ×4 IMPLANT
SUT ETHILON 3 0 PS 1 (SUTURE) ×4 IMPLANT
SUT MNCRL 3 0 RB1 (SUTURE) ×2 IMPLANT
SUT MNCRL 3 0 VIOLET RB1 (SUTURE) ×2 IMPLANT
SUT MNCRL AB 4-0 PS2 18 (SUTURE) ×8 IMPLANT
SUT MONOCRYL 3 0 RB1 (SUTURE) ×4
SUT VIC AB 0 CT1 27 (SUTURE) ×3
SUT VIC AB 0 CT1 27XBRD ANTBC (SUTURE) ×2 IMPLANT
SUT VIC AB 0 CT1 36 (SUTURE) ×4 IMPLANT
SUT VIC AB 2-0 SH 27 (SUTURE) ×4
SUT VIC AB 2-0 SH 27X BRD (SUTURE) ×2 IMPLANT
SUT VICRYL 0 UR6 27IN ABS (SUTURE) ×8 IMPLANT
SYR 27GX1/2 1ML LL SAFETY (SYRINGE) ×4 IMPLANT
TOWEL OR 17X26 10 PK STRL BLUE (TOWEL DISPOSABLE) ×4 IMPLANT
TOWEL OR NON WOVEN STRL DISP B (DISPOSABLE) ×4 IMPLANT
WATER STERILE IRR 1500ML POUR (IV SOLUTION) ×8 IMPLANT

## 2014-11-12 NOTE — Progress Notes (Signed)
Lab work noted-Hgb. 12.1- Hct. 38.1

## 2014-11-12 NOTE — Anesthesia Preprocedure Evaluation (Addendum)
Anesthesia Evaluation  Patient identified by MRN, date of birth, ID band Patient awake    Reviewed: Allergy & Precautions, NPO status , Patient's Chart, lab work & pertinent test results  Airway Mallampati: II  TM Distance: >3 FB Neck ROM: Full    Dental no notable dental hx.    Pulmonary neg pulmonary ROS,  breath sounds clear to auscultation  Pulmonary exam normal       Cardiovascular Exercise Tolerance: Good hypertension, Pt. on medications Rhythm:Regular Rate:Normal     Neuro/Psych negative neurological ROS  negative psych ROS   GI/Hepatic negative GI ROS, Neg liver ROS,   Endo/Other  negative endocrine ROS  Renal/GU Renal InsufficiencyRenal disease  negative genitourinary   Musculoskeletal  (+) Arthritis -,   Abdominal (+) + obese,   Peds negative pediatric ROS (+)  Hematology negative hematology ROS (+)   Anesthesia Other Findings   Reproductive/Obstetrics negative OB ROS                            Anesthesia Physical Anesthesia Plan  ASA: II  Anesthesia Plan: General   Post-op Pain Management:    Induction: Intravenous  Airway Management Planned: Oral ETT  Additional Equipment:   Intra-op Plan:   Post-operative Plan: Extubation in OR  Informed Consent: I have reviewed the patients History and Physical, chart, labs and discussed the procedure including the risks, benefits and alternatives for the proposed anesthesia with the patient or authorized representative who has indicated his/her understanding and acceptance.   Dental advisory given  Plan Discussed with: CRNA  Anesthesia Plan Comments:         Anesthesia Quick Evaluation

## 2014-11-12 NOTE — Anesthesia Procedure Notes (Signed)
Procedure Name: Intubation Date/Time: 11/12/2014 11:05 AM Performed by: Glory Buff Pre-anesthesia Checklist: Patient identified, Emergency Drugs available, Suction available, Patient being monitored and Timeout performed Patient Re-evaluated:Patient Re-evaluated prior to inductionOxygen Delivery Method: Circle system utilized Preoxygenation: Pre-oxygenation with 100% oxygen Intubation Type: IV induction Ventilation: Mask ventilation without difficulty and Oral airway inserted - appropriate to patient size Laryngoscope Size: Mac and 4 Grade View: Grade II Tube type: Oral Tube size: 7.5 mm Number of attempts: 1 Airway Equipment and Method: Stylet Placement Confirmation: positive ETCO2 and breath sounds checked- equal and bilateral Secured at: 23 cm Tube secured with: Tape Dental Injury: Teeth and Oropharynx as per pre-operative assessment

## 2014-11-12 NOTE — Transfer of Care (Signed)
Immediate Anesthesia Transfer of Care Note  Patient: Lance Richardson  Procedure(s) Performed: Procedure(s): ROBOTIC ASSISTED LAPAROSCOPIC RADICAL PROSTATECTOMY LEVEL 1  (N/A) BILATERAL PELVIC LYMPHADENECTOMY (Bilateral)  Patient Location: PACU  Anesthesia Type:General  Level of Consciousness: awake, alert  and oriented  Airway & Oxygen Therapy: Patient Spontanous Breathing and Patient connected to face mask oxygen  Post-op Assessment: Report given to PACU RN and Post -op Vital signs reviewed and stable  Post vital signs: Reviewed and stable  Complications: No apparent anesthesia complications

## 2014-11-12 NOTE — Op Note (Signed)

## 2014-11-12 NOTE — Anesthesia Postprocedure Evaluation (Signed)
  Anesthesia Post-op Note  Patient: Lance Richardson  Procedure(s) Performed: Procedure(s) (LRB): ROBOTIC ASSISTED LAPAROSCOPIC RADICAL PROSTATECTOMY LEVEL 1  (N/A) BILATERAL PELVIC LYMPHADENECTOMY (Bilateral)  Patient Location: PACU  Anesthesia Type: General  Level of Consciousness: awake and alert   Airway and Oxygen Therapy: Patient Spontanous Breathing  Post-op Pain: mild  Post-op Assessment: Post-op Vital signs reviewed, Patient's Cardiovascular Status Stable, Respiratory Function Stable, Patent Airway and No signs of Nausea or vomiting  Last Vitals:  Filed Vitals:   11/12/14 1644  BP: 147/73  Pulse: 70  Temp: 36.9 C  Resp: 16    Post-op Vital Signs: stable   Complications: No apparent anesthesia complications

## 2014-11-12 NOTE — Progress Notes (Signed)
Hgb. And Hct. Drawn by lab. 

## 2014-11-12 NOTE — Progress Notes (Signed)
Utilization review completed.  

## 2014-11-12 NOTE — Discharge Instructions (Signed)

## 2014-11-12 NOTE — Interval H&P Note (Signed)
History and Physical Interval Note:  11/12/2014 10:47 AM  Lance Richardson  has presented today for surgery, with the diagnosis of PROSTATE CANCER  The various methods of treatment have been discussed with the patient and family. After consideration of risks, benefits and other options for treatment, the patient has consented to  Procedure(s): ROBOTIC ASSISTED LAPAROSCOPIC RADICAL PROSTATECTOMY LEVEL 1 BILATERAL PELVIC LYNADENECTOMY  (N/A) BILATERAL PELVIC LYMPHADENECTOMY (Bilateral) as a surgical intervention .  The patient's history has been reviewed, patient examined, no change in status, stable for surgery.  I have reviewed the patient's chart and labs.  Questions were answered to the patient's satisfaction.     Tetsuo Coppola,LES

## 2014-11-13 ENCOUNTER — Encounter (HOSPITAL_COMMUNITY): Payer: Self-pay | Admitting: Urology

## 2014-11-13 LAB — HEMOGLOBIN AND HEMATOCRIT, BLOOD
HCT: 32.9 % — ABNORMAL LOW (ref 39.0–52.0)
HEMOGLOBIN: 10.8 g/dL — AB (ref 13.0–17.0)

## 2014-11-13 MED ORDER — BISACODYL 10 MG RE SUPP
10.0000 mg | Freq: Once | RECTAL | Status: AC
  Administered 2014-11-13: 10 mg via RECTAL
  Filled 2014-11-13: qty 1

## 2014-11-13 MED ORDER — HYDROCODONE-ACETAMINOPHEN 5-325 MG PO TABS
1.0000 | ORAL_TABLET | Freq: Four times a day (QID) | ORAL | Status: DC | PRN
  Administered 2014-11-13: 2 via ORAL
  Filled 2014-11-13: qty 2

## 2014-11-13 NOTE — Care Management Note (Signed)
    Page 1 of 1   11/13/2014     1:24:25 PM CARE MANAGEMENT NOTE 11/13/2014  Patient:  Lance Richardson, Lance Richardson   Account Number:  [id number]  Date Initiated:  11/13/2014  Documentation initiated by:  Dessa Phi  Subjective/Objective Assessment:   59 y/o m admitted w/prostate ca.     Action/Plan:   From home.   Anticipated DC Date:  11/13/2014   Anticipated DC Plan:  [place]  CM consult      Choice offered to / List presented to:             Status of service:  Completed, signed off Medicare Important Message given?   (If response is "NO", the following Medicare IM given date fields will be blank) Date Medicare IM given:   Medicare IM given by:   Date Additional Medicare IM given:   Additional Medicare IM given by:    Discharge Disposition:  HOME/SELF CARE  Per UR Regulation:  Reviewed for med. necessity/level of care/duration of stay  If discussed at [place] of Stay Meetings, dates discussed:    Comments:  11/13/14 Dessa Phi RN BSN [phone] No needs or orders.

## 2014-11-13 NOTE — Discharge Summary (Signed)
  Date of admission: 11/12/2014  Date of discharge: 11/13/2014  Admission diagnosis: Prostate Cancer  Discharge diagnosis: Prostate Cancer  History and Physical: For full details, please see admission history and physical. Briefly, Lance Richardson is a 59 y.o. gentleman with localized prostate cancer.  After discussing management/treatment options, he elected to proceed with surgical treatment.  Hospital Course: Lance Richardson was taken to the operating room on 11/12/2014 and underwent a robotic assisted laparoscopic radical prostatectomy. He tolerated this procedure well and without complications. Postoperatively, he was able to be transferred to a regular hospital room following recovery from anesthesia.  He was able to begin ambulating the night of surgery. He remained hemodynamically stable overnight.  He had excellent urine output with appropriately minimal output from his pelvic drain and his pelvic drain was removed on POD #1.  He was transitioned to oral pain medication, tolerated a clear liquid diet, and had met all discharge criteria and was able to be discharged home later on POD#1.  Laboratory values:  Recent Labs  11/12/14 1533 11/13/14 0442  HGB 12.1* 10.8*  HCT 38.1* 32.9*    Disposition: Home  Discharge instruction: He was instructed to be ambulatory but to refrain from heavy lifting, strenuous activity, or driving. He was instructed on urethral catheter care.  Discharge medications:     Medication List    STOP taking these medications        b complex vitamins tablet     BLACK CHERRY CONCENTRATE PO     ibuprofen 200 MG tablet  Commonly known as:  ADVIL,MOTRIN     tadalafil 5 MG tablet  Commonly known as:  CIALIS     UNABLE TO FIND     vitamin C 500 MG tablet  Commonly known as:  ASCORBIC ACID      TAKE these medications        ciprofloxacin 500 MG tablet  Commonly known as:  CIPRO  Take 1 tablet (500 mg total) by mouth 2 (two) times daily. Start day prior  to office visit for foley removal     HYDROcodone-acetaminophen 5-325 MG per tablet  Commonly known as:  NORCO  Take 1-2 tablets by mouth every 6 (six) hours as needed.     lisinopril-hydrochlorothiazide 10-12.5 MG per tablet  Commonly known as:  PRINZIDE,ZESTORETIC  Take 1 tablet by mouth every morning.     minoxidil 2 % external solution  Commonly known as:  ROGAINE  Apply 1 application topically 2 (two) times daily as needed (spot on the back of his head.).        Followup: He will followup in 1 week for catheter removal and to discuss his surgical pathology results.

## 2014-11-13 NOTE — Progress Notes (Signed)
Patient ID: Lance Richardson, male   DOB: Mar 02, 1956, 59 y.o.   MRN: [id number]  1 Day Post-Op Subjective: The patient is doing well.  No nausea or vomiting. Pain is adequately controlled.  Objective: Vital signs in last 24 hours: Temp:  [97.3 F (36.3 C)-98.7 F (37.1 C)] 98.4 F (36.9 C) (01/08 0857) Pulse Rate:  [53-75] 56 (01/08 0857) Resp:  [10-18] 18 (01/08 0857) BP: (134-177)/(61-82) 143/65 mmHg (01/08 0857) SpO2:  [96 %-100 %] 96 % (01/08 0857) Weight:  [110.6 kg (243 lb 13.3 oz)-110.678 kg (244 lb)] 110.6 kg (243 lb 13.3 oz) (01/07 1724)  Intake/Output from previous day: 01/07 0701 - 01/08 0700 In: 3775 [I.V.:2725; IV Piggyback:1050] Out: 8315 [Urine:1025; Drains:120; Blood:200] Intake/Output this shift: Total I/O In: -  Out: 850 [Urine:800; Drains:50]  Physical Exam:  General: Alert and oriented. CV: RRR Lungs: Clear bilaterally. GI: Soft, Nondistended. Incisions: Clean, dry, and intact Urine: Clear Extremities: Nontender, no erythema, no edema.  Lab Results:  Recent Labs  11/12/14 1533 11/13/14 0442  HGB 12.1* 10.8*  HCT 38.1* 32.9*      Assessment/Plan: POD# 1 s/p robotic prostatectomy.  1) SL IVF 2) Ambulate, Incentive spirometry 3) Transition to oral pain medication 4) Dulcolax suppository 5) D/C pelvic drain 6) Plan for likely discharge later today   Pryor Curia. MD   LOS: 1 day   Jaclyn Carew,LES 11/13/2014, 9:45 AM

## 2014-12-04 DIAGNOSIS — M6281 Muscle weakness (generalized): Secondary | ICD-10-CM | POA: Diagnosis not present

## 2014-12-04 DIAGNOSIS — C61 Malignant neoplasm of prostate: Secondary | ICD-10-CM | POA: Diagnosis not present

## 2014-12-11 DIAGNOSIS — C61 Malignant neoplasm of prostate: Secondary | ICD-10-CM | POA: Diagnosis not present

## 2014-12-11 DIAGNOSIS — M6281 Muscle weakness (generalized): Secondary | ICD-10-CM | POA: Diagnosis not present

## 2014-12-23 DIAGNOSIS — Z79899 Other long term (current) drug therapy: Secondary | ICD-10-CM | POA: Diagnosis not present

## 2014-12-23 DIAGNOSIS — N400 Enlarged prostate without lower urinary tract symptoms: Secondary | ICD-10-CM | POA: Diagnosis not present

## 2014-12-23 DIAGNOSIS — I10 Essential (primary) hypertension: Secondary | ICD-10-CM | POA: Diagnosis not present

## 2014-12-23 DIAGNOSIS — M109 Gout, unspecified: Secondary | ICD-10-CM | POA: Diagnosis not present

## 2014-12-23 DIAGNOSIS — C61 Malignant neoplasm of prostate: Secondary | ICD-10-CM | POA: Diagnosis not present

## 2014-12-23 DIAGNOSIS — M6281 Muscle weakness (generalized): Secondary | ICD-10-CM | POA: Diagnosis not present

## 2014-12-23 DIAGNOSIS — D649 Anemia, unspecified: Secondary | ICD-10-CM | POA: Diagnosis not present

## 2015-01-08 DIAGNOSIS — C61 Malignant neoplasm of prostate: Secondary | ICD-10-CM | POA: Diagnosis not present

## 2015-01-13 DIAGNOSIS — C61 Malignant neoplasm of prostate: Secondary | ICD-10-CM | POA: Diagnosis not present

## 2015-01-13 DIAGNOSIS — M6281 Muscle weakness (generalized): Secondary | ICD-10-CM | POA: Diagnosis not present

## 2015-01-13 DIAGNOSIS — N393 Stress incontinence (female) (male): Secondary | ICD-10-CM | POA: Diagnosis not present

## 2015-05-19 DIAGNOSIS — C61 Malignant neoplasm of prostate: Secondary | ICD-10-CM | POA: Diagnosis not present

## 2015-05-19 DIAGNOSIS — N5201 Erectile dysfunction due to arterial insufficiency: Secondary | ICD-10-CM | POA: Diagnosis not present

## 2015-05-19 DIAGNOSIS — N393 Stress incontinence (female) (male): Secondary | ICD-10-CM | POA: Diagnosis not present

## 2015-08-04 DIAGNOSIS — N289 Disorder of kidney and ureter, unspecified: Secondary | ICD-10-CM | POA: Diagnosis not present

## 2015-08-04 DIAGNOSIS — M109 Gout, unspecified: Secondary | ICD-10-CM | POA: Diagnosis not present

## 2015-08-04 DIAGNOSIS — I10 Essential (primary) hypertension: Secondary | ICD-10-CM | POA: Diagnosis not present

## 2015-08-04 DIAGNOSIS — D649 Anemia, unspecified: Secondary | ICD-10-CM | POA: Diagnosis not present

## 2015-08-26 DIAGNOSIS — L910 Hypertrophic scar: Secondary | ICD-10-CM | POA: Diagnosis not present

## 2015-08-26 DIAGNOSIS — Z419 Encounter for procedure for purposes other than remedying health state, unspecified: Secondary | ICD-10-CM | POA: Diagnosis not present

## 2015-11-24 DIAGNOSIS — C61 Malignant neoplasm of prostate: Secondary | ICD-10-CM | POA: Diagnosis not present

## 2015-11-24 DIAGNOSIS — N5201 Erectile dysfunction due to arterial insufficiency: Secondary | ICD-10-CM | POA: Diagnosis not present

## 2015-11-24 DIAGNOSIS — N393 Stress incontinence (female) (male): Secondary | ICD-10-CM | POA: Diagnosis not present

## 2015-11-24 DIAGNOSIS — Z0000 Encounter for general adult medical examination without abnormal findings: Secondary | ICD-10-CM | POA: Diagnosis not present

## 2016-01-21 DIAGNOSIS — N5201 Erectile dysfunction due to arterial insufficiency: Secondary | ICD-10-CM | POA: Diagnosis not present

## 2016-01-21 DIAGNOSIS — M6281 Muscle weakness (generalized): Secondary | ICD-10-CM | POA: Diagnosis not present

## 2016-01-21 DIAGNOSIS — C61 Malignant neoplasm of prostate: Secondary | ICD-10-CM | POA: Diagnosis not present

## 2016-02-09 DIAGNOSIS — M109 Gout, unspecified: Secondary | ICD-10-CM | POA: Diagnosis not present

## 2016-02-09 DIAGNOSIS — C61 Malignant neoplasm of prostate: Secondary | ICD-10-CM | POA: Diagnosis not present

## 2016-02-09 DIAGNOSIS — N189 Chronic kidney disease, unspecified: Secondary | ICD-10-CM | POA: Diagnosis not present

## 2016-02-09 DIAGNOSIS — I10 Essential (primary) hypertension: Secondary | ICD-10-CM | POA: Diagnosis not present

## 2016-02-09 DIAGNOSIS — D649 Anemia, unspecified: Secondary | ICD-10-CM | POA: Diagnosis not present

## 2016-06-21 DIAGNOSIS — C61 Malignant neoplasm of prostate: Secondary | ICD-10-CM | POA: Diagnosis not present

## 2016-06-21 DIAGNOSIS — N393 Stress incontinence (female) (male): Secondary | ICD-10-CM | POA: Diagnosis not present

## 2016-06-21 DIAGNOSIS — N5201 Erectile dysfunction due to arterial insufficiency: Secondary | ICD-10-CM | POA: Diagnosis not present

## 2016-07-19 DIAGNOSIS — L910 Hypertrophic scar: Secondary | ICD-10-CM | POA: Diagnosis not present

## 2021-02-04 DEATH — deceased
